# Patient Record
Sex: Female | Born: 1960
Health system: Southern US, Community
[De-identification: ages and names within clinical notes are randomized; demographics above are authoritative.]

## PROBLEM LIST (undated history)

## (undated) DIAGNOSIS — D6851 Activated protein C resistance: Secondary | ICD-10-CM

## (undated) DIAGNOSIS — E039 Hypothyroidism, unspecified: Secondary | ICD-10-CM

## (undated) DIAGNOSIS — N200 Calculus of kidney: Secondary | ICD-10-CM

## (undated) DIAGNOSIS — C4491 Basal cell carcinoma of skin, unspecified: Secondary | ICD-10-CM

## (undated) DIAGNOSIS — N029 Recurrent and persistent hematuria with unspecified morphologic changes: Secondary | ICD-10-CM

## (undated) DIAGNOSIS — G43909 Migraine, unspecified, not intractable, without status migrainosus: Secondary | ICD-10-CM

## (undated) HISTORY — PX: COLONOSCOPY: SHX174

## (undated) HISTORY — PX: UPPER GASTROINTESTINAL ENDOSCOPY: SHX188

## (undated) HISTORY — DX: Migraine, unspecified, not intractable, without status migrainosus: G43.909

## (undated) HISTORY — DX: Calculus of kidney: N20.0

## (undated) HISTORY — PX: EYE SURGERY: SHX253

## (undated) HISTORY — DX: Recurrent and persistent hematuria with unspecified morphologic changes: N02.9

## (undated) HISTORY — DX: Basal cell carcinoma of skin, unspecified: C44.91

## (undated) HISTORY — PX: RHINOPLASTY: SUR1284

---

## 1998-07-07 ENCOUNTER — Encounter: Admission: RE | Admit: 1998-07-07 | Discharge: 1998-10-05 | Payer: Self-pay | Admitting: Endocrinology

## 2001-04-28 ENCOUNTER — Other Ambulatory Visit: Admission: RE | Admit: 2001-04-28 | Discharge: 2001-04-28 | Payer: Self-pay | Admitting: Gynecology

## 2002-04-30 ENCOUNTER — Other Ambulatory Visit: Admission: RE | Admit: 2002-04-30 | Discharge: 2002-04-30 | Payer: Self-pay | Admitting: Gynecology

## 2003-04-10 ENCOUNTER — Other Ambulatory Visit: Admission: RE | Admit: 2003-04-10 | Discharge: 2003-04-10 | Payer: Self-pay | Admitting: Gynecology

## 2004-04-13 ENCOUNTER — Other Ambulatory Visit: Admission: RE | Admit: 2004-04-13 | Discharge: 2004-04-13 | Payer: Self-pay | Admitting: Gynecology

## 2004-10-07 ENCOUNTER — Other Ambulatory Visit: Admission: RE | Admit: 2004-10-07 | Discharge: 2004-10-07 | Payer: Self-pay | Admitting: Gynecology

## 2005-04-23 ENCOUNTER — Other Ambulatory Visit: Admission: RE | Admit: 2005-04-23 | Discharge: 2005-04-23 | Payer: Self-pay | Admitting: Gynecology

## 2006-03-11 ENCOUNTER — Other Ambulatory Visit: Admission: RE | Admit: 2006-03-11 | Discharge: 2006-03-11 | Payer: Self-pay | Admitting: Obstetrics and Gynecology

## 2006-06-07 HISTORY — PX: CERVICAL BIOPSY  W/ LOOP ELECTRODE EXCISION: SUR135

## 2007-04-26 ENCOUNTER — Other Ambulatory Visit: Admission: RE | Admit: 2007-04-26 | Discharge: 2007-04-26 | Payer: Self-pay | Admitting: Obstetrics and Gynecology

## 2007-08-28 ENCOUNTER — Other Ambulatory Visit: Admission: RE | Admit: 2007-08-28 | Discharge: 2007-08-28 | Payer: Self-pay | Admitting: Obstetrics and Gynecology

## 2008-04-26 ENCOUNTER — Encounter: Payer: Self-pay | Admitting: Obstetrics and Gynecology

## 2008-04-26 ENCOUNTER — Other Ambulatory Visit: Admission: RE | Admit: 2008-04-26 | Discharge: 2008-04-26 | Payer: Self-pay | Admitting: Obstetrics and Gynecology

## 2008-04-26 ENCOUNTER — Ambulatory Visit: Payer: Self-pay | Admitting: Obstetrics and Gynecology

## 2009-01-10 ENCOUNTER — Ambulatory Visit: Payer: Self-pay | Admitting: Obstetrics and Gynecology

## 2009-01-17 ENCOUNTER — Encounter: Payer: Self-pay | Admitting: Internal Medicine

## 2009-03-03 ENCOUNTER — Ambulatory Visit: Payer: Self-pay | Admitting: Internal Medicine

## 2009-03-03 DIAGNOSIS — R932 Abnormal findings on diagnostic imaging of liver and biliary tract: Secondary | ICD-10-CM | POA: Insufficient documentation

## 2009-03-14 ENCOUNTER — Ambulatory Visit: Payer: Self-pay | Admitting: Internal Medicine

## 2009-03-18 ENCOUNTER — Telehealth (INDEPENDENT_AMBULATORY_CARE_PROVIDER_SITE_OTHER): Payer: Self-pay | Admitting: *Deleted

## 2009-04-25 ENCOUNTER — Ambulatory Visit: Payer: Self-pay | Admitting: Internal Medicine

## 2009-05-07 ENCOUNTER — Ambulatory Visit: Payer: Self-pay | Admitting: Obstetrics and Gynecology

## 2009-05-07 ENCOUNTER — Other Ambulatory Visit: Admission: RE | Admit: 2009-05-07 | Discharge: 2009-05-07 | Payer: Self-pay | Admitting: Obstetrics and Gynecology

## 2010-05-15 ENCOUNTER — Other Ambulatory Visit
Admission: RE | Admit: 2010-05-15 | Discharge: 2010-05-15 | Payer: Self-pay | Source: Home / Self Care | Admitting: Gynecology

## 2010-05-15 ENCOUNTER — Ambulatory Visit: Payer: Self-pay | Admitting: Gynecology

## 2011-01-29 ENCOUNTER — Ambulatory Visit (INDEPENDENT_AMBULATORY_CARE_PROVIDER_SITE_OTHER): Payer: 59 | Admitting: Obstetrics and Gynecology

## 2011-01-29 ENCOUNTER — Telehealth: Payer: Self-pay | Admitting: Obstetrics and Gynecology

## 2011-01-29 ENCOUNTER — Encounter: Payer: Self-pay | Admitting: Obstetrics and Gynecology

## 2011-01-29 ENCOUNTER — Telehealth: Payer: Self-pay | Admitting: *Deleted

## 2011-01-29 DIAGNOSIS — N390 Urinary tract infection, site not specified: Secondary | ICD-10-CM

## 2011-01-29 DIAGNOSIS — R82998 Other abnormal findings in urine: Secondary | ICD-10-CM

## 2011-01-29 DIAGNOSIS — R319 Hematuria, unspecified: Secondary | ICD-10-CM

## 2011-01-29 DIAGNOSIS — R300 Dysuria: Secondary | ICD-10-CM

## 2011-01-29 MED ORDER — NITROFURANTOIN MONOHYD MACRO 100 MG PO CAPS: 100.0000 mg | ORAL_CAPSULE | Freq: Two times a day (BID) | ORAL | Status: AC

## 2011-01-29 NOTE — Telephone Encounter (Signed)
Patient states UTI symptoms are getting worse.  Painful urination, tired, and head ache not going away.  She wasn't sure if this was all tied in.  Going out of town.  Can stop by to drop a U/A of shortly or if possible call RX in?

## 2011-01-29 NOTE — Telephone Encounter (Signed)
Left message on patient's mobile to contact office for urine check & ov today with Dr. Eda Paschal.

## 2011-01-29 NOTE — Telephone Encounter (Signed)
Have patient stop by for urinalysis and I will see her.

## 2011-01-29 NOTE — Progress Notes (Signed)
Patient came to see me today with a 3 day history of back pain dysuria frequency and urgency. Urinalysis shows too numerous to count white blood cells and red blood cells. She does have a history of microscopic hematuria.  Assessment: UTI  Plan: 1. Macrobid twice a day with food for 7 days 2. Follow-up urinalysis and culture in one week. Obviously the microscopic hematuria will not be completely gone but I want to be sure that it's not as severe as it was today

## 2011-01-29 NOTE — Telephone Encounter (Signed)
Patient called back and said she was on the way to be seen.

## 2011-01-29 NOTE — Telephone Encounter (Signed)
Patient had left msg about UTI symptoms. Urinary burning.  Wanted to leave U/A today, going out of town. Left msg to get more details.

## 2011-01-29 NOTE — Telephone Encounter (Deleted)
Left message on patient's mobile to contact office regarding urine check & ov today with Dr. Eda Paschal.

## 2011-01-29 NOTE — Telephone Encounter (Signed)
Laurel Dimmer Called at 1:20 pm and lm for patient to stop by for U/A or appointment.

## 2011-02-01 ENCOUNTER — Other Ambulatory Visit: Payer: 59

## 2011-02-05 ENCOUNTER — Ambulatory Visit (INDEPENDENT_AMBULATORY_CARE_PROVIDER_SITE_OTHER): Payer: 59 | Admitting: *Deleted

## 2011-02-05 DIAGNOSIS — N390 Urinary tract infection, site not specified: Secondary | ICD-10-CM

## 2011-02-05 DIAGNOSIS — R300 Dysuria: Secondary | ICD-10-CM

## 2011-02-05 DIAGNOSIS — R823 Hemoglobinuria: Secondary | ICD-10-CM

## 2011-02-09 ENCOUNTER — Telehealth: Payer: Self-pay

## 2011-02-10 ENCOUNTER — Other Ambulatory Visit: Payer: Self-pay

## 2011-02-10 DIAGNOSIS — N390 Urinary tract infection, site not specified: Secondary | ICD-10-CM

## 2011-02-10 NOTE — Telephone Encounter (Signed)
OPENED IN ERROR

## 2011-03-08 ENCOUNTER — Other Ambulatory Visit: Payer: 59

## 2011-03-08 DIAGNOSIS — R823 Hemoglobinuria: Secondary | ICD-10-CM

## 2011-03-08 DIAGNOSIS — N390 Urinary tract infection, site not specified: Secondary | ICD-10-CM

## 2011-03-10 ENCOUNTER — Telehealth: Payer: Self-pay | Admitting: *Deleted

## 2011-03-10 MED ORDER — NITROFURANTOIN MONOHYD MACRO 100 MG PO CAPS: 100.0000 mg | ORAL_CAPSULE | Freq: Two times a day (BID) | ORAL | Status: AC

## 2011-03-10 NOTE — Telephone Encounter (Signed)
Pt had a UTI treated 02/10/11 with Cipro 250 mg BID 7days and negative culture on 03/08/11 for TOC. She now  Complains of increased urinary frequency which was the same way the last UTI started out. Pt is asking for another RX esp since she was just here on  10/1 with a negative culture. Pls advise KW

## 2011-03-10 NOTE — Telephone Encounter (Signed)
Pt informed rx sent.

## 2011-03-10 NOTE — Telephone Encounter (Signed)
Please treat the patient with Macrobid twice a day with food for 7 days. I would like to see her in approximately one week for followup urine into discussed that this may be overactive bladder symptoms.

## 2011-03-29 ENCOUNTER — Other Ambulatory Visit: Payer: Self-pay | Admitting: *Deleted

## 2011-03-29 ENCOUNTER — Encounter: Payer: Self-pay | Admitting: Gynecology

## 2011-03-29 DIAGNOSIS — N390 Urinary tract infection, site not specified: Secondary | ICD-10-CM

## 2011-03-29 DIAGNOSIS — G43909 Migraine, unspecified, not intractable, without status migrainosus: Secondary | ICD-10-CM | POA: Insufficient documentation

## 2011-03-29 DIAGNOSIS — E079 Disorder of thyroid, unspecified: Secondary | ICD-10-CM | POA: Insufficient documentation

## 2011-04-02 ENCOUNTER — Telehealth: Payer: Self-pay | Admitting: *Deleted

## 2011-04-02 NOTE — Telephone Encounter (Signed)
Patient called today to say she is wanting to cancel appointment for next week to discuss bladder issues.  Said she has annual exam scheduled and will follow up then.  If she has any further issues before then she will make an appointment to come back in .

## 2011-04-05 ENCOUNTER — Ambulatory Visit: Payer: 59 | Admitting: Obstetrics and Gynecology

## 2011-05-20 ENCOUNTER — Encounter: Payer: Self-pay | Admitting: Obstetrics and Gynecology

## 2011-05-24 ENCOUNTER — Other Ambulatory Visit: Payer: Self-pay | Admitting: *Deleted

## 2011-05-24 DIAGNOSIS — N63 Unspecified lump in unspecified breast: Secondary | ICD-10-CM

## 2011-05-26 ENCOUNTER — Encounter: Payer: Self-pay | Admitting: Obstetrics and Gynecology

## 2011-05-26 ENCOUNTER — Other Ambulatory Visit: Payer: Self-pay | Admitting: Gynecology

## 2011-05-26 ENCOUNTER — Ambulatory Visit (INDEPENDENT_AMBULATORY_CARE_PROVIDER_SITE_OTHER): Payer: 59 | Admitting: Obstetrics and Gynecology

## 2011-05-26 ENCOUNTER — Other Ambulatory Visit (HOSPITAL_COMMUNITY)
Admission: RE | Admit: 2011-05-26 | Discharge: 2011-05-26 | Disposition: A | Discharge status: Home / Self Care | Payer: 59 | Source: Ambulatory Visit | Attending: Obstetrics and Gynecology | Admitting: Obstetrics and Gynecology

## 2011-05-26 VITALS — BP 120/78 | Ht 67.0 in | Wt 142.0 lb

## 2011-05-26 DIAGNOSIS — Z01419 Encounter for gynecological examination (general) (routine) without abnormal findings: Secondary | ICD-10-CM

## 2011-05-26 DIAGNOSIS — R823 Hemoglobinuria: Secondary | ICD-10-CM

## 2011-05-26 DIAGNOSIS — N63 Unspecified lump in unspecified breast: Secondary | ICD-10-CM

## 2011-05-26 DIAGNOSIS — E039 Hypothyroidism, unspecified: Secondary | ICD-10-CM

## 2011-05-26 MED ORDER — LEVOTHYROXINE SODIUM 125 MCG PO TABS: 125.0000 ug | ORAL_TABLET | Freq: Every day | ORAL | Status: DC

## 2011-05-26 NOTE — Progress Notes (Signed)
Patient came to see me today for her annual GYN exam. She still has very heavy periods but not enough to require endometrial ablation. She just got recall for followup mammogram and is going today. She contraceptives by vasectomy. This year she had problems with a UTI which required 2 rounds of antibiotics to get rid of but she is now asymptomatic. We are treating her for hypothyroidism. She is currently asymptomatic.  Physical examination:  Sheila Strickland present HEENT within normal limits. Neck: Thyroid not large. No masses. Supraclavicular nodes: not enlarged. Breasts: Examined in both sitting midline position. No skin changes and no masses. Abdomen: Soft no guarding rebound or masses or hernia. Pelvic: External: Within normal limits. BUS: Within normal limits. Vaginal:within normal limits. Good estrogen effect. No evidence of cystocele rectocele or enterocele. Cervix: clean. Uterus: Normal size and shape. Adnexa: No masses. Rectovaginal exam: Confirmatory and negative. Extremities: Within normal limits.  Assessment: #1. Menorrhagia #2. Hypothyroidism  Plan: TSH checked. Continue Synthroid. Information given on Lysteda. Followup mammogram.

## 2011-05-26 NOTE — Progress Notes (Signed)
Addended byCammie Mcgee T on: 05/26/2011 10:26 AM   Modules accepted: Orders

## 2011-06-30 ENCOUNTER — Other Ambulatory Visit: Payer: Self-pay | Admitting: Dermatology

## 2011-07-29 ENCOUNTER — Other Ambulatory Visit: Payer: Self-pay | Admitting: Dermatology

## 2011-07-31 ENCOUNTER — Other Ambulatory Visit: Payer: Self-pay | Admitting: Gynecology

## 2011-08-02 ENCOUNTER — Other Ambulatory Visit: Payer: Self-pay | Admitting: *Deleted

## 2011-08-02 MED ORDER — LEVOTHYROXINE SODIUM 125 MCG PO TABS: 125.0000 ug | ORAL_TABLET | Freq: Every day | ORAL | Status: DC

## 2011-08-02 NOTE — Telephone Encounter (Signed)
Pt called requesting refill on synthroid 12 mcg, pt asked if we could place okay to take generic brand. rx sent to pharmacy for pt.

## 2011-08-04 ENCOUNTER — Encounter: Payer: Self-pay | Admitting: Internal Medicine

## 2011-08-18 ENCOUNTER — Encounter: Payer: Self-pay | Admitting: Internal Medicine

## 2011-09-28 ENCOUNTER — Encounter: Payer: 59 | Admitting: Internal Medicine

## 2011-10-06 HISTORY — PX: COLONOSCOPY: SHX174

## 2011-10-13 ENCOUNTER — Ambulatory Visit (AMBULATORY_SURGERY_CENTER): Payer: 59 | Admitting: *Deleted

## 2011-10-13 ENCOUNTER — Encounter: Payer: Self-pay | Admitting: Internal Medicine

## 2011-10-13 VITALS — Ht 67.0 in | Wt 139.6 lb

## 2011-10-13 DIAGNOSIS — Z1211 Encounter for screening for malignant neoplasm of colon: Secondary | ICD-10-CM

## 2011-10-13 MED ORDER — PEG-KCL-NACL-NASULF-NA ASC-C 100 G PO SOLR: 1.0000 | Freq: Once | ORAL | Status: DC

## 2011-10-22 ENCOUNTER — Encounter: Payer: 59 | Admitting: Internal Medicine

## 2011-10-25 ENCOUNTER — Encounter: Payer: Self-pay | Admitting: Internal Medicine

## 2011-10-25 ENCOUNTER — Ambulatory Visit (AMBULATORY_SURGERY_CENTER): Payer: 59 | Admitting: Internal Medicine

## 2011-10-25 VITALS — BP 132/77 | HR 64 | Temp 97.0°F | Resp 16 | Ht 67.0 in | Wt 139.0 lb

## 2011-10-25 DIAGNOSIS — Z1211 Encounter for screening for malignant neoplasm of colon: Secondary | ICD-10-CM

## 2011-10-25 MED ORDER — SODIUM CHLORIDE 0.9 % IV SOLN: 500.0000 mL | INTRAVENOUS | Status: DC

## 2011-10-25 NOTE — Patient Instructions (Signed)
YOU HAD AN ENDOSCOPIC PROCEDURE TODAY AT THE [facility]: Refer to the procedure report that was given to you for any specific questions about what was found during the examination.  If the procedure report does not answer your questions, please call your gastroenterologist to clarify.  If you requested that your care partner not be given the details of your procedure findings, then the procedure report has been included in a sealed envelope for you to review at your convenience later.  YOU SHOULD EXPECT: Some feelings of bloating in the abdomen. Passage of more gas than usual.  Walking can help get rid of the air that was put into your GI tract during the procedure and reduce the bloating. If you had a lower endoscopy (such as a colonoscopy or flexible sigmoidoscopy) you may notice spotting of blood in your stool or on the toilet paper. If you underwent a bowel prep for your procedure, then you may not have a normal bowel movement for a few days.  DIET: Your first meal following the procedure should be a light meal and then it is ok to progress to your normal diet.  A half-sandwich or bowl of soup is an example of a good first meal.  Heavy or fried foods are harder to digest and may make you feel nauseous or bloated.  Likewise meals heavy in dairy and vegetables can cause extra gas to form and this can also increase the bloating.  Drink plenty of fluids but you should avoid alcoholic beverages for 24 hours.  ACTIVITY: Your care partner should take you home directly after the procedure.  You should plan to take it easy, moving slowly for the rest of the day.  You can resume normal activity the day after the procedure however you should NOT DRIVE or use heavy machinery for 24 hours (because of the sedation medicines used during the test).    SYMPTOMS TO REPORT IMMEDIATELY: A gastroenterologist can be reached at any hour.  During normal business hours, 8:30 AM to 5:00 PM Monday through Friday,  call [phone].  After hours and on weekends, please call the GI answering service at [phone] who will take a message and have the physician on call contact you.   Following lower endoscopy (colonoscopy or flexible sigmoidoscopy):  Excessive amounts of blood in the stool  Significant tenderness or worsening of abdominal pains  Swelling of the abdomen that is new, acute  Fever of 100F or higher    FOLLOW UP: If any biopsies were taken you will be contacted by phone or by letter within the next 1-3 weeks.  Call your gastroenterologist if you have not heard about the biopsies in 3 weeks.  Our staff will call the home number listed on your records the next business day following your procedure to check on you and address any questions or concerns that you may have at that time regarding the information given to you following your procedure. This is a courtesy call and so if there is no answer at the home number and we have not heard from you through the emergency physician on call, we will assume that you have returned to your regular daily activities without incident.  SIGNATURES/CONFIDENTIALITY: You and/or your care partner have signed paperwork which will be entered into your electronic medical record.  These signatures attest to the fact that that the information above on your After Visit Summary has been reviewed and is understood.  Full responsibility of the confidentiality   of this discharge information lies with you and/or your care-partner.     

## 2011-10-25 NOTE — Progress Notes (Signed)
Patient did not have preoperative order for IV antibiotic SSI prophylaxis. (G8918)  Patient did not experience any of the following events: a burn prior to discharge; a fall within the facility; wrong site/side/patient/procedure/implant event; or a hospital transfer or hospital admission upon discharge from the facility. (G8907)  

## 2011-10-25 NOTE — Op Note (Signed)
[facility] [address] [place], [place]  [address]  COLONOSCOPY PROCEDURE REPORT  PATIENT:  Sheila Strickland, Sheila Strickland  MR#:  [id number] BIRTHDATE:  04-15-1961, 50 yrs. old  GENDER:  female ENDOSCOPIST:  Wilhemina Bonito. Eda Keys, MD REF. BY:  Edyth Gunnels, M.D. PROCEDURE DATE:  10/25/2011 PROCEDURE:  Average-risk screening colonoscopy G0121 ASA CLASS:  Class I INDICATIONS:  Routine Risk Screening MEDICATIONS:   MAC sedation, administered by CRNA, propofol (Diprivan) 360 mg IV  DESCRIPTION OF PROCEDURE:   After the risks benefits and alternatives of the procedure were thoroughly explained, informed consent was obtained.  Digital rectal exam was performed and revealed no abnormalities.   The LB [id number]-[id number] endoscope was introduced through the anus and advanced to the cecum, which was identified by both the appendix and ileocecal valve, without limitations.  The quality of the prep was excellent, using MoviPrep.  The instrument was then slowly withdrawn as the colon was fully examined. <<PROCEDUREIMAGES>>  FINDINGS:  A normal appearing cecum, ileocecal valve, and appendiceal orifice were identified. The ascending, hepatic flexure, transverse, splenic flexure, descending, sigmoid colon, and rectum appeared unremarkable.  No polyps or cancers were seen. Retroflexed views in the rectum revealed no abnormalities.    The time to cecum =  5:18  minutes. The scope was then withdrawn in 11:20  minutes from the cecum and the procedure completed.  COMPLICATIONS:  None  ENDOSCOPIC IMPRESSION: 1) Normal colon 2) No polyps or cancers RECOMMENDATIONS: 1) Continue current colorectal screening recommendations for "routine risk" patients with a repeat colonoscopy in 10 years.  ______________________________ Wilhemina Bonito. Eda Keys, MD  CC:  Carmelina Peal, MD;  The Patient  n. eSIGNED:   Wilhemina Bonito. Eda Keys at 10/25/2011 10:00 AM  Willey Blade, [phone]

## 2011-10-26 ENCOUNTER — Telehealth: Payer: Self-pay

## 2011-10-26 NOTE — Telephone Encounter (Signed)
  Follow up Call-  Call back number 10/25/2011  Post procedure Call Back phone  # [phone] cell  Permission to leave phone message Yes     Patient questions:  Do you have a fever, pain , or abdominal swelling? no Pain Score  0 *  Have you tolerated food without any problems? yes  Have you been able to return to your normal activities? yes  Do you have any questions about your discharge instructions: Diet   no Medications  no Follow up visit  no  Do you have questions or concerns about your Care? no  Actions: * If pain score is 4 or above: No action needed, pain <4.

## 2012-02-02 ENCOUNTER — Ambulatory Visit (INDEPENDENT_AMBULATORY_CARE_PROVIDER_SITE_OTHER): Payer: 59

## 2012-02-02 ENCOUNTER — Other Ambulatory Visit: Payer: Self-pay | Admitting: Obstetrics and Gynecology

## 2012-02-02 ENCOUNTER — Ambulatory Visit (INDEPENDENT_AMBULATORY_CARE_PROVIDER_SITE_OTHER): Payer: 59 | Admitting: Obstetrics and Gynecology

## 2012-02-02 DIAGNOSIS — R140 Abdominal distension (gaseous): Secondary | ICD-10-CM

## 2012-02-02 DIAGNOSIS — N852 Hypertrophy of uterus: Secondary | ICD-10-CM

## 2012-02-02 DIAGNOSIS — D259 Leiomyoma of uterus, unspecified: Secondary | ICD-10-CM

## 2012-02-02 DIAGNOSIS — R142 Eructation: Secondary | ICD-10-CM

## 2012-02-02 DIAGNOSIS — N831 Corpus luteum cyst of ovary, unspecified side: Secondary | ICD-10-CM

## 2012-02-02 DIAGNOSIS — R141 Gas pain: Secondary | ICD-10-CM

## 2012-02-02 DIAGNOSIS — D252 Subserosal leiomyoma of uterus: Secondary | ICD-10-CM

## 2012-02-02 NOTE — Progress Notes (Addendum)
Patient came to see me today with a 3 month history of abdominal bloating. It is there most days. It is not associated with nausea, vomiting or change in bowel habits. She is having no urinary symptoms. She continues to have monthly cycles. It is not related to her cycles. She feels like her stomach is very tight but is not get worse or better with eating. She has had a colonoscopy through [facility] office.her husband has had a vasectomy.  Exam: Kennon Portela present. Abdomen is soft without guarding rebound or masses. There is no evidence of ascites.Pelvic exam: External within normal limits. BUS within normal limits. Vaginal exam within normal limits. Cervix is clean without lesions. Uterus is normal size and shape. Adnexa failed to reveal masses. Rectovaginal examination is confirmatory and without masses.   Assessment: Abdominal bloating  Plan: Pelvic ultrasound.  The patient came back this afternoon and we did an ultrasound. She has a very large fibroid 5 cm that is subserosal and I suspect is causing some of her symptoms. There is an arterial blood flow to it. In addition to this fibroid there is an echogenic focus in the endometrial cavity of 1.2 cm that is either another fibroid or an endometrial polyp or endometrial echo has changed from 10.3 mm in 2008 to 22 mm today. Her right ovary is normal. Her left ovary has a thick walled cyst with internal low level echoes of 3 by 2 x 2 centimeters. There is minimal fluid in her cul-de-sac. Our plan will be to address the endometrial cavity lesion first and we will schedule a D&C hysteroscopy. I have also asked to see Dr. Marina Goodell to be sure there isn't a GI reason for some of her abdominal bloating. She will then have to the side with a symptoms warrant hysterectomy. Our plan will be prior to that 2 rescan her and see if she's had resolution of the left ovarian cyst. Her surgery will be scheduled.

## 2012-02-02 NOTE — Patient Instructions (Signed)
Schedule pelvic ultrasound

## 2012-02-03 ENCOUNTER — Encounter (HOSPITAL_COMMUNITY): Payer: Self-pay

## 2012-02-03 ENCOUNTER — Telehealth: Payer: Self-pay | Admitting: Obstetrics and Gynecology

## 2012-02-03 NOTE — Telephone Encounter (Signed)
I called patient to let her know I had scheduled her outpatient surgery for Sept 9.  Patient had some concerns if she might not be finished with menses by then and asked to schedule it later in the week.  I rescheduled her for Thurs Sept 12 1:00pm at [facility].

## 2012-02-08 ENCOUNTER — Telehealth: Payer: Self-pay | Admitting: *Deleted

## 2012-02-08 NOTE — Telephone Encounter (Signed)
Pt is scheduled for Hysteroscopy, D&C on 02/17/12, pt asked if she could have ablation done same day?

## 2012-02-08 NOTE — Telephone Encounter (Signed)
Is not technically possible to do so because of whatever is in the endometrial cavity. If she has other questions get me to the phone.

## 2012-02-08 NOTE — Telephone Encounter (Signed)
Pt informed with the below note. 

## 2012-02-11 ENCOUNTER — Encounter (HOSPITAL_COMMUNITY): Payer: Self-pay | Admitting: *Deleted

## 2012-02-14 NOTE — H&P (Signed)
  Chief complaint: Intrauterine cavity defect with menorrhagia  History of present illness: Patient is a 51 year old gravida 2 para 2 AB 0 who presented to the office with a 3 month history of abdominal bloating and lower abdominal pressure. Over the past 1-2 years patient has also suffered from significant menorrhagia but has elected no definitive treatment with hopes that  menopause will intervene. When she presented to the office with her new symptoms she underwent pelvic ultrasound which revealed a large 5 cm fibroid and a thick walled 3 cm left ovarian cyst. In addition there was an intrauterine cavity defect of 1-2 cm. The plan is to proceed with hysteroscopy, D&C with removal of intrauterine pathology. Pending pathology report and whether left ovarian cyst resolves and abdominal symptoms persist future surgery may be planned.   Past medical history, family history, social history, and review of systems in epic record and reviewed.  Physical examination: HEENT within normal limits. Neck: Thyroid not large. No masses. Supraclavicular nodes: not enlarged. Breasts: Examined in both sitting and lying  position. No skin changes and no masses. Abdomen: Soft no guarding rebound or masses or hernia. Pelvic: External: Within normal limits. BUS: Within normal limits. Vaginal:within normal limits. Good estrogen effect. No evidence of cystocele rectocele or enterocele. Cervix: clean. Uterus: enlarged by 5 cm fibroid. Adnexa: No masses. Cyst on left ovary not palpable. Rectovaginal exam: Confirmatory and negative. Extremities: Within normal limits.  Assessment: #1. Menorrhagia with intrauterine cavity defect. #2 large fibroid #3 left ovarian cyst.  Plan: D&C, hysteroscopy with excision of cavity defect.

## 2012-02-17 ENCOUNTER — Encounter (HOSPITAL_COMMUNITY): Payer: Self-pay | Admitting: Registered Nurse

## 2012-02-17 ENCOUNTER — Encounter (HOSPITAL_COMMUNITY): Payer: Self-pay | Admitting: *Deleted

## 2012-02-17 ENCOUNTER — Encounter (HOSPITAL_COMMUNITY)
Admission: RE | Disposition: A | Discharge status: Home / Self Care | Payer: Self-pay | Source: Ambulatory Visit | Attending: Obstetrics and Gynecology

## 2012-02-17 ENCOUNTER — Ambulatory Visit (HOSPITAL_COMMUNITY): Payer: 59 | Admitting: Registered Nurse

## 2012-02-17 ENCOUNTER — Ambulatory Visit (HOSPITAL_COMMUNITY)
Admission: RE | Admit: 2012-02-17 | Discharge: 2012-02-17 | Disposition: A | Discharge status: Home / Self Care | Payer: 59 | Source: Ambulatory Visit | Attending: Obstetrics and Gynecology | Admitting: Obstetrics and Gynecology

## 2012-02-17 DIAGNOSIS — D251 Intramural leiomyoma of uterus: Secondary | ICD-10-CM | POA: Insufficient documentation

## 2012-02-17 DIAGNOSIS — N920 Excessive and frequent menstruation with regular cycle: Secondary | ICD-10-CM | POA: Insufficient documentation

## 2012-02-17 DIAGNOSIS — N840 Polyp of corpus uteri: Secondary | ICD-10-CM

## 2012-02-17 HISTORY — DX: Hypothyroidism, unspecified: E03.9

## 2012-02-17 HISTORY — PX: DILATION AND CURETTAGE OF UTERUS: SHX78

## 2012-02-17 HISTORY — PX: HYSTEROSCOPY W/D&C: SHX1775

## 2012-02-17 LAB — HCG, SERUM, QUALITATIVE: Preg, Serum: NEGATIVE

## 2012-02-17 LAB — CBC
Hemoglobin: 14.4 g/dL (ref 12.0–15.0)
MCHC: 33 g/dL (ref 30.0–36.0)
Platelets: 169 10*3/uL (ref 150–400)

## 2012-02-17 SURGERY — DILATION AND CURETTAGE
Anesthesia: General | Site: Uterus | Wound class: Clean Contaminated

## 2012-02-17 MED ORDER — MIDAZOLAM HCL 5 MG/5ML IJ SOLN
INTRAMUSCULAR | Status: DC | PRN
  Administered 2012-02-17: 2 mg via INTRAVENOUS

## 2012-02-17 MED ORDER — GLYCINE 1.5 % IR SOLN
Status: DC | PRN
  Administered 2012-02-17: 3000 mL

## 2012-02-17 MED ORDER — PROPOFOL 10 MG/ML IV EMUL
INTRAVENOUS | Status: AC
  Filled 2012-02-17: qty 20

## 2012-02-17 MED ORDER — DEXAMETHASONE SODIUM PHOSPHATE 10 MG/ML IJ SOLN
INTRAMUSCULAR | Status: DC | PRN
  Administered 2012-02-17: 10 mg via INTRAVENOUS

## 2012-02-17 MED ORDER — DEXAMETHASONE SODIUM PHOSPHATE 10 MG/ML IJ SOLN
INTRAMUSCULAR | Status: AC
  Filled 2012-02-17: qty 1

## 2012-02-17 MED ORDER — PROPOFOL 10 MG/ML IV BOLUS
INTRAVENOUS | Status: DC | PRN
  Administered 2012-02-17: 200 mg via INTRAVENOUS

## 2012-02-17 MED ORDER — CEFAZOLIN SODIUM-DEXTROSE 2-3 GM-% IV SOLR
INTRAVENOUS | Status: AC
  Filled 2012-02-17: qty 50

## 2012-02-17 MED ORDER — ONDANSETRON HCL 4 MG/2ML IJ SOLN: 4.0000 mg | Freq: Once | INTRAMUSCULAR | Status: DC | PRN

## 2012-02-17 MED ORDER — KETOROLAC TROMETHAMINE 30 MG/ML IJ SOLN: 15.0000 mg | Freq: Once | INTRAMUSCULAR | Status: DC | PRN

## 2012-02-17 MED ORDER — LIDOCAINE HCL 1 % IJ SOLN
INTRAMUSCULAR | Status: DC | PRN
  Administered 2012-02-17: 20 mL

## 2012-02-17 MED ORDER — LIDOCAINE HCL (CARDIAC) 20 MG/ML IV SOLN
INTRAVENOUS | Status: DC | PRN
  Administered 2012-02-17: 50 mg via INTRAVENOUS

## 2012-02-17 MED ORDER — KETOROLAC TROMETHAMINE 30 MG/ML IJ SOLN
INTRAMUSCULAR | Status: AC
  Filled 2012-02-17: qty 1

## 2012-02-17 MED ORDER — CEFAZOLIN SODIUM-DEXTROSE 2-3 GM-% IV SOLR
2.0000 g | INTRAVENOUS | Status: AC
  Administered 2012-02-17: 2 g via INTRAVENOUS

## 2012-02-17 MED ORDER — LIDOCAINE HCL (CARDIAC) 20 MG/ML IV SOLN
INTRAVENOUS | Status: AC
  Filled 2012-02-17: qty 5

## 2012-02-17 MED ORDER — ONDANSETRON HCL 4 MG/2ML IJ SOLN
INTRAMUSCULAR | Status: DC | PRN
  Administered 2012-02-17: 4 mg via INTRAVENOUS

## 2012-02-17 MED ORDER — MIDAZOLAM HCL 2 MG/2ML IJ SOLN
INTRAMUSCULAR | Status: AC
  Filled 2012-02-17: qty 2

## 2012-02-17 MED ORDER — FENTANYL CITRATE 0.05 MG/ML IJ SOLN: 25.0000 ug | INTRAMUSCULAR | Status: DC | PRN

## 2012-02-17 MED ORDER — FENTANYL CITRATE 0.05 MG/ML IJ SOLN
INTRAMUSCULAR | Status: DC | PRN
  Administered 2012-02-17 (×2): 50 ug via INTRAVENOUS

## 2012-02-17 MED ORDER — SODIUM CHLORIDE 0.9 % IR SOLN
Status: DC | PRN
  Administered 2012-02-17: 6000 mL

## 2012-02-17 MED ORDER — MEPERIDINE HCL 25 MG/ML IJ SOLN: 6.2500 mg | INTRAMUSCULAR | Status: DC | PRN

## 2012-02-17 MED ORDER — ONDANSETRON HCL 4 MG/2ML IJ SOLN
INTRAMUSCULAR | Status: AC
  Filled 2012-02-17: qty 2

## 2012-02-17 MED ORDER — KETOROLAC TROMETHAMINE 30 MG/ML IJ SOLN
INTRAMUSCULAR | Status: DC | PRN
  Administered 2012-02-17: 30 mg via INTRAVENOUS

## 2012-02-17 MED ORDER — LACTATED RINGERS IV SOLN
INTRAVENOUS | Status: DC
  Administered 2012-02-17 (×2): via INTRAVENOUS

## 2012-02-17 MED ORDER — FENTANYL CITRATE 0.05 MG/ML IJ SOLN
INTRAMUSCULAR | Status: AC
Start: 2012-02-17 — End: 2012-02-17
  Filled 2012-02-17: qty 2

## 2012-02-17 SURGICAL SUPPLY — 26 items
CANISTER SUCTION 2500CC (MISCELLANEOUS) ×2 IMPLANT
CLOTH BEACON ORANGE TIMEOUT ST (SAFETY) ×2 IMPLANT
CONTAINER PREFILL 10% NBF 60ML (FORM) ×4 IMPLANT
CORD ACTIVE DISPOSABLE (ELECTRODE) ×1
CORD ELECTRO ACTIVE DISP (ELECTRODE) ×1 IMPLANT
DRESSING TELFA 8X3 (GAUZE/BANDAGES/DRESSINGS) ×6 IMPLANT
ELECT LOOP GYNE PRO 24FR (CUTTING LOOP) ×2
ELECT REM PT RETURN 9FT ADLT (ELECTROSURGICAL)
ELECTRODE LOOP GYNE PRO 24FR (CUTTING LOOP) ×1 IMPLANT
ELECTRODE REM PT RTRN 9FT ADLT (ELECTROSURGICAL) IMPLANT
GLOVE BIOGEL PI IND STRL 7.0 (GLOVE) ×3 IMPLANT
GLOVE BIOGEL PI IND STRL 7.5 (GLOVE) ×2 IMPLANT
GLOVE BIOGEL PI INDICATOR 7.0 (GLOVE) ×3
GLOVE BIOGEL PI INDICATOR 7.5 (GLOVE) ×2
GLOVE ECLIPSE 7.0 STRL STRAW (GLOVE) ×2 IMPLANT
GLOVE NEODERM STER SZ 7 (GLOVE) ×6 IMPLANT
GOWN PREVENTION PLUS LG XLONG (DISPOSABLE) ×4 IMPLANT
GOWN STRL REIN XL XLG (GOWN DISPOSABLE) ×2 IMPLANT
LOOP ANGLED CUTTING 22FR (CUTTING LOOP) ×2 IMPLANT
MORCELLATOR ROTARY HYSTERO (ABLATOR) ×2 IMPLANT
NEEDLE SPNL 22GX3.5 QUINCKE BK (NEEDLE) ×2 IMPLANT
PACK VAGINAL MINOR WOMEN LF (CUSTOM PROCEDURE TRAY) ×2 IMPLANT
PAD OB MATERNITY 4.3X12.25 (PERSONAL CARE ITEMS) ×2 IMPLANT
SYR 20CC LL (SYRINGE) ×2 IMPLANT
TOWEL OR 17X24 6PK STRL BLUE (TOWEL DISPOSABLE) ×4 IMPLANT
WATER STERILE IRR 1000ML POUR (IV SOLUTION) ×2 IMPLANT

## 2012-02-17 NOTE — Transfer of Care (Signed)
Immediate Anesthesia Transfer of Care Note  Patient: Sheila Strickland  Procedure(s) Performed: Procedure(s) (LRB) with comments: DILATATION AND CURETTAGE (N/A) - with truclear  Patient Location: PACU  Anesthesia Type: General  Level of Consciousness: awake, alert  and patient cooperative  Airway & Oxygen Therapy: Patient Spontanous Breathing and Patient connected to nasal cannula oxygen  Post-op Assessment: Report given to PACU RN  Post vital signs: Reviewed  Complications: No apparent anesthesia complications

## 2012-02-17 NOTE — Anesthesia Preprocedure Evaluation (Addendum)
Anesthesia Evaluation  Patient identified by MRN, date of birth, ID band Patient awake    Reviewed: Allergy & Precautions, H&P , NPO status , Patient's Chart, lab work & pertinent test results  Airway Mallampati: I TM Distance: >3 FB Neck ROM: full    Dental No notable dental hx. (+) Teeth Intact   Pulmonary neg pulmonary ROS,    Pulmonary exam normal       Cardiovascular negative cardio ROS      Neuro/Psych negative psych ROS   GI/Hepatic negative GI ROS, Neg liver ROS,   Endo/Other  Hypothyroidism   Renal/GU negative Renal ROS  negative genitourinary   Musculoskeletal negative musculoskeletal ROS (+)   Abdominal Normal abdominal exam  (+)   Peds negative pediatric ROS (+)  Hematology negative hematology ROS (+)   Anesthesia Other Findings   Reproductive/Obstetrics negative OB ROS                           Anesthesia Physical Anesthesia Plan  ASA: II  Anesthesia Plan: General   Post-op Pain Management:    Induction: Intravenous  Airway Management Planned: LMA  Additional Equipment:   Intra-op Plan:   Post-operative Plan:   Informed Consent: I have reviewed the patients History and Physical, chart, labs and discussed the procedure including the risks, benefits and alternatives for the proposed anesthesia with the patient or authorized representative who has indicated his/her understanding and acceptance.     Plan Discussed with: CRNA and Surgeon  Anesthesia Plan Comments:         Anesthesia Quick Evaluation

## 2012-02-17 NOTE — Interval H&P Note (Signed)
History and Physical Interval Note:  02/17/2012 12:46 PM  Sheila Strickland  has presented today for surgery, with the diagnosis of submucous myoma  The various methods of treatment have been discussed with the patient and family. After consideration of risks, benefits and other options for treatment, the patient has consented to  Procedure(s) (LRB) with comments: DILATATION AND CURETTAGE /HYSTEROSCOPY (N/A) - Hysteroscopy, D&C with Resectoscope and TruClear Morcellator. I have arranged for rep.,Holly Bishop to be present for case.  Needs 1 hour as a surgical intervention .  The patient's history has been reviewed, patient examined, no change in status, stable for surgery.  I have reviewed the patient's chart and labs.  Questions were answered to the patient's satisfaction.     GOTTSEGEN,DANIEL L

## 2012-02-17 NOTE — Anesthesia Postprocedure Evaluation (Signed)
  Anesthesia Post-op Note  Patient: Sheila Strickland  Procedure(s) Performed: Procedure(s) (LRB) with comments: DILATATION AND CURETTAGE (N/A) - attempted with truclear DILATATION AND CURETTAGE /HYSTEROSCOPY (N/A) - with polyp resection X 2 polyps  Patient Location: PACU  Anesthesia Type: General  Level of Consciousness: awake, alert  and oriented  Airway and Oxygen Therapy: Patient Spontanous Breathing  Post-op Pain: none  Post-op Assessment: Post-op Vital signs reviewed, Patient's Cardiovascular Status Stable, Respiratory Function Stable, Patent Airway, No signs of Nausea or vomiting and Pain level controlled  Post-op Vital Signs: Reviewed and stable  Complications: No apparent anesthesia complications

## 2012-02-17 NOTE — Preoperative (Signed)
Beta Blockers   Reason not to administer Beta Blockers:Not Applicable 

## 2012-02-17 NOTE — Op Note (Signed)
Preoperative diagnosis: Intrauterine cavity defect with menorrhagia. Intramural myoma.. Postoperative diagnosis: Endometrial polyps, menorrhagia, intramural myoma. Operation: Hysteroscopy with excision of endometrial polyps. Surgeon: Dr. Eda Paschal Anesthesia: Gen.  Findings: External and vaginal exam within normal limits. Cervix is clean. Uterus is enlarged by 5 cm fibroid coming off the right lateral wall. Neither adnexa is enlarged. Previous ovarian cyst on left ovary is not palpable. Uterus sounds to 8 cm. At the time of hysteroscopy patient had 2 endometrial polyps of greater than 1 cm each. One came off the anterior vaginal and one of the posterior wall. Other than that  the endometrial cavity was normal. Top of the  fundus, tubal ostia, anterior and posterior walls of the fundus, lower uterine segment, and endocervical canal were all  Normal. There was first degree uterine descensus.  Procedure: After general anesthesia the patient was placed in the dorsolithotomy position and prepped and draped in usual sterile manner. A speculum was placed in the vagina and a single-tooth tenaculum was placed the anterior lip of the cervix. The cervix was dilated to a #27 Pratt dilator. Initially the true clear hysteroscope was used with this entire system. Normal saline was used to expand the intrauterine cavity. The pathology was identified. When it was time to place the instrument to remove the polyps it was found that it been contaminated. There was no other sterile part. It was therefore removed. A hysteroscopic resectoscope was introduced with a single wire loop. The entire system was flushed with 1 and 1/2% glycine. Both the tubing and the intrauterine cavity were flushed. The cavity was flushed for 2 minutes. The appropriate Bovie settings were used with the resectoscope. The polyps were then excised without difficulty. There was no bleeding. Fluid deficit was 150 cc. Blood loss was minimal. All  instrumentation was removed. Tissue was sent to pathology for tissue diagnosis. The patient tolerated procedure well and left the operating room in a satisfactory condition.

## 2012-02-18 ENCOUNTER — Encounter (HOSPITAL_COMMUNITY): Payer: Self-pay | Admitting: Obstetrics and Gynecology

## 2012-02-24 ENCOUNTER — Telehealth: Payer: Self-pay | Admitting: *Deleted

## 2012-02-24 NOTE — Telephone Encounter (Signed)
Pt informed of pathology report 02/17/12. benign report.

## 2012-03-03 ENCOUNTER — Ambulatory Visit (INDEPENDENT_AMBULATORY_CARE_PROVIDER_SITE_OTHER): Payer: 59 | Admitting: Obstetrics and Gynecology

## 2012-03-03 ENCOUNTER — Encounter: Payer: Self-pay | Admitting: Obstetrics and Gynecology

## 2012-03-03 DIAGNOSIS — N920 Excessive and frequent menstruation with regular cycle: Secondary | ICD-10-CM

## 2012-03-03 DIAGNOSIS — N840 Polyp of corpus uteri: Secondary | ICD-10-CM

## 2012-03-03 NOTE — Progress Notes (Signed)
Patient came back to see me today for a postoperative visit. She had an uneventful recovery. Her pathology report showed benign endometrial polyps without hyperplasia. She stated that her abdominal bloating is now getting better. She has an appointment to see Dr. Marina Goodell to be sure she does not have a GI problem causing the bloating.  Exam: Leonard Schwartz present.Pelvic exam: External within normal limits. BUS within normal limits. Vaginal exam within normal limits. Cervix is clean without lesions. Uterus is normal size and shape with myoma of 5 cm originating from posterior right wall. Adnexa failed to reveal masses. Rectovaginal examination is confirmatory and without masses.   Assessment: #1. Menorrhagia #2. Endometrial polyps #3. Subserosal myoma  Plan: Discussed findings of surgery. Patient to see Dr. Marina Goodell. Patient is not symptomatic enough from  fibroid at this point for hysterectomy. If menorrhagia persists she will call and we will schedule endometrial ablation.

## 2012-03-03 NOTE — Patient Instructions (Signed)
Keep me  updated on menstrual cycles.

## 2012-03-22 ENCOUNTER — Ambulatory Visit: Payer: 59 | Admitting: Internal Medicine

## 2012-03-29 ENCOUNTER — Ambulatory Visit (INDEPENDENT_AMBULATORY_CARE_PROVIDER_SITE_OTHER): Payer: 59 | Admitting: Internal Medicine

## 2012-03-29 ENCOUNTER — Encounter: Payer: Self-pay | Admitting: Internal Medicine

## 2012-03-29 VITALS — BP 100/70 | HR 68 | Ht 66.5 in | Wt 139.5 lb

## 2012-03-29 DIAGNOSIS — R143 Flatulence: Secondary | ICD-10-CM

## 2012-03-29 DIAGNOSIS — R142 Eructation: Secondary | ICD-10-CM

## 2012-03-29 DIAGNOSIS — R1084 Generalized abdominal pain: Secondary | ICD-10-CM

## 2012-03-29 NOTE — Progress Notes (Addendum)
HISTORY OF PRESENT ILLNESS:  Sheila Strickland is a 51 y.o. female with the below listed medical history who presents today, at the request of her gynecologist, regarding a 5-6 month history of abdominal bloating or tightness. She states that she has had similar symptoms several times per year over the past several years. Problem generally resolve spontaneously within 4 weeks.Marland Kitchen However, this occurrence is more persistent and troublesome. She describes a fullness sensation in the lower abdomen shortly after awakening. She does not describe it as pain, but rather tightness. The discomfort is fairly constant. There has not been increased belching or flatus. She denies any change in bowel habits. No new medications or diet. Mild relief with defecation. She was seen in this office in November of 2010 for followup regarding nausea and globus type sensation. As well, regarding stable hepatic hemangioma and gallbladder polyps. See that dictation. Prior upper endoscopy to evaluate these complaints was performed October 2010 and found to be unremarkable. She was seen again in May of 2013 for screening colonoscopy which was entirely normal. This year she underwent her routine urologic evaluation for chronic hematuria. She also has undergone gynecology evaluations for abnormal uterine bleeding. A transvaginal ultrasound performed 02/02/2012 revealed a 5 cm uterine fibroid. She subsequently underwent a D&C 02/17/2012 to address uterine polyps and bleeding. She denies back pain, weight loss, or fevers. Recently returned from [place]. Review of CBC from 02/17/2012 found the study to be normal with hemoglobin 14.4  REVIEW OF SYSTEMS:  All non-GI ROS negative except for headaches  Past Medical History  Diagnosis Date  . CIN I (cervical intraepithelial neoplasia I) 2008    LEEP  . Hematuria   . Hypothyroidism   . SVD (spontaneous vaginal delivery)     x 2  . Migraines     opc med prns  . Basal cell carcinoma     arm,  thigh, hand    Past Surgical History  Procedure Date  . Cervical biopsy  w/ loop electrode excision 2008  . Colposcopy   . Rhinoplasty   . Upper gastrointestinal endoscopy   . Colonoscopy   . Eye surgery     Lasik eye - bilateral  . Dilation and curettage of uterus 02/17/2012    Procedure: DILATATION AND CURETTAGE;  Surgeon: Trellis Paganini, MD;  Location: [facility];  Service: Gynecology;  Laterality: N/A;  attempted with truclear  . Hysteroscopy w/d&c 02/17/2012    Procedure: DILATATION AND CURETTAGE /HYSTEROSCOPY;  Surgeon: Trellis Paganini, MD;  Location: [facility];  Service: Gynecology;  Laterality: N/A;  with polyp resection X 2 polyps    Social History Sheila Strickland  reports that she has never smoked. She has never used smokeless tobacco. She reports that she drinks about one ounce of alcohol per week. She reports that she does not use illicit drugs.  family history includes Anuerysm in her father; Breast cancer in her maternal aunt; Colon polyps in her mother; and Hypertension in her father.  There is no history of Colon cancer, and Esophageal cancer, and Rectal cancer, and Stomach cancer, .  No Known Allergies     PHYSICAL EXAMINATION: Vital signs: BP 100/70  Pulse 68  Ht 5' 6.5" (1.689 m)  Wt 139 lb 8 oz (63.277 kg)  BMI 22.18 kg/m2 General: Well-developed, well-nourished, no acute distress HEENT: Sclerae are anicteric, conjunctiva pink. Oral mucosa intact Lungs: Clear Heart: Regular Abdomen: soft, nontender, nondistended, no obvious ascites, no peritoneal signs, normal bowel sounds. No organomegaly.  Extremities: No edema Psychiatric: alert and oriented x3. Cooperative    ASSESSMENT:  #1. Chronic and persistent lower abdominal discomfort described as bloating or tightness. Not obviously GI in origin. Negative recent urologic and GYN evaluations. She is known to have a 5 cm uterine fibroid. Previous colonoscopy and upper endoscopy normal.  PLAN:  #1. Empiric  trial of probiotic Align. 3 weeks of samples given #2. Educational literature on intestinal gas, bloating, and dietary sheet. #3. Contrast-enhanced CT scan of the abdomen and pelvis to rule out other pathologic entities such as retroperitoneal disease. Further recommendations thereafter if needed.  ADDENDUM After relieving the office, the patient realize that she had an imaging study performed at her urologists office earlier this year. Indeed, a CT scan of the abdomen and pelvis with and without contrast was performed 07/20/2011. The examination revealed bilateral nonobstructing nephrolithiasis, uterine fibroid, and hemangioma of the liver (previously aware of). As such, we will hold off on CT scan imaging as planned above. She will continue with probiotic and anti-gas measures and contact the office if her problem persists or worsens. We have discussed this with her by telephone today.

## 2012-03-29 NOTE — Patient Instructions (Addendum)
ou have been scheduled for a CT scan of the abdomen and pelvis at [facility] ([address]---this is in the same building as Architectural technologist).   You are scheduled on 03-31-12 at 9:30am. You should arrive 15 minutes prior to your appointment time for registration. Please follow the written instructions below on the day of your exam:  WARNING: IF YOU ARE ALLERGIC TO IODINE/X-RAY DYE, PLEASE NOTIFY RADIOLOGY IMMEDIATELY AT [phone]! YOU WILL BE GIVEN A 13 HOUR PREMEDICATION PREP.  1) Do not eat or drink anything after 5:30am (4 hours prior to your test) 2) You have been given 2 bottles of oral contrast to drink. The solution may taste better if refrigerated, but do NOT add ice or any other liquid to this solution. Shake well before drinking.    Drink 1 bottle of contrast @ 7:30am (2 hours prior to your exam)  Drink 1 bottle of contrast @ 8:30am (1 hour prior to your exam)  You may take any medications as prescribed with a small amount of water except for the following: Metformin, Glucophage, Glucovance, Avandamet, Riomet, Fortamet, Actoplus Met, Janumet, Glumetza or Metaglip. The above medications must be held the day of the exam AND 48 hours after the exam.  The purpose of you drinking the oral contrast is to aid in the visualization of your intestinal tract. The contrast solution may cause some diarrhea. Before your exam is started, you will be given a small amount of fluid to drink. Depending on your individual set of symptoms, you may also receive an intravenous injection of x-ray contrast/dye. Plan on being at [facility] for 30 minutes or long, depending on the type of exam you are having performed.  If you have any questions regarding your exam or if you need to reschedule, you may call the CT department at [phone] between the hours of 8:00 am and 5:00 pm, Monday-Friday.  We have given you samples of Align. This puts good bacteria back into your colon. You  should take 1 capsule by mouth once daily. If this works well for you, it can be purchased over the counter.   ________________________________________________________________________

## 2012-03-31 ENCOUNTER — Telehealth: Payer: Self-pay

## 2012-03-31 ENCOUNTER — Inpatient Hospital Stay: Admission: RE | Admit: 2012-03-31 | Payer: 59 | Source: Ambulatory Visit

## 2012-03-31 NOTE — Telephone Encounter (Signed)
I called patient and told her that Dr. Marina Goodell had reviewed her previous CT and said it looked good.  He said a new CT would not be necessary at this time and for her to keep taking her probiotic. I told her that, per Dr. Marina Goodell, if her symptoms returned or got worse we could look at rescheduling the CT at that time.  Patient understood and agreed

## 2012-03-31 NOTE — Telephone Encounter (Signed)
Misty Stanley from CT called and said patient realized that she had already had a CT abd/pel last February and wondered if the one Dr. Marina Goodell scheduled for today was necessary.  Misty Stanley faxed me a copy of the ct for Dr. Marina Goodell to review.

## 2012-05-26 ENCOUNTER — Encounter: Payer: 59 | Admitting: Gynecology

## 2012-05-29 ENCOUNTER — Encounter: Payer: Self-pay | Admitting: Gynecology

## 2012-06-02 ENCOUNTER — Telehealth: Payer: Self-pay | Admitting: *Deleted

## 2012-06-02 DIAGNOSIS — Z01419 Encounter for gynecological examination (general) (routine) without abnormal findings: Secondary | ICD-10-CM

## 2012-06-02 NOTE — Telephone Encounter (Signed)
Pt has annual schedule on 06/06/12 @ 2:00pm. She would like to come in on Monday 06/05/12 to have blood work drawn so she could come in fasting. Pt said you normally have TSH,CBC,U/A, FLP drawn. Okay to place order?

## 2012-06-02 NOTE — Telephone Encounter (Signed)
Pt informed, order placed, pt will come in on 07/07/11 @ 9:30 am

## 2012-06-02 NOTE — Telephone Encounter (Signed)
I would recommend CBC, comprehensive metabolic panel, lipid profile, TSH, vitamin D, urinalysis.

## 2012-06-05 ENCOUNTER — Other Ambulatory Visit: Payer: 59

## 2012-06-05 DIAGNOSIS — Z01419 Encounter for gynecological examination (general) (routine) without abnormal findings: Secondary | ICD-10-CM

## 2012-06-05 LAB — CBC
HCT: 43.5 % (ref 36.0–46.0)
Hemoglobin: 14.5 g/dL (ref 12.0–15.0)
MCH: 29 pg (ref 26.0–34.0)
MCHC: 33.3 g/dL (ref 30.0–36.0)
MCV: 87 fL (ref 78.0–100.0)
RBC: 5 MIL/uL (ref 3.87–5.11)

## 2012-06-06 ENCOUNTER — Ambulatory Visit (INDEPENDENT_AMBULATORY_CARE_PROVIDER_SITE_OTHER): Payer: 59 | Admitting: Gynecology

## 2012-06-06 ENCOUNTER — Other Ambulatory Visit (HOSPITAL_COMMUNITY)
Admission: RE | Admit: 2012-06-06 | Discharge: 2012-06-06 | Disposition: A | Discharge status: Home / Self Care | Payer: 59 | Source: Ambulatory Visit | Attending: Gynecology | Admitting: Gynecology

## 2012-06-06 ENCOUNTER — Encounter: Payer: Self-pay | Admitting: Gynecology

## 2012-06-06 VITALS — BP 120/76 | Ht 66.5 in | Wt 144.0 lb

## 2012-06-06 DIAGNOSIS — Z8669 Personal history of other diseases of the nervous system and sense organs: Secondary | ICD-10-CM

## 2012-06-06 DIAGNOSIS — N920 Excessive and frequent menstruation with regular cycle: Secondary | ICD-10-CM

## 2012-06-06 DIAGNOSIS — Z01419 Encounter for gynecological examination (general) (routine) without abnormal findings: Secondary | ICD-10-CM

## 2012-06-06 DIAGNOSIS — E039 Hypothyroidism, unspecified: Secondary | ICD-10-CM

## 2012-06-06 DIAGNOSIS — Z1151 Encounter for screening for human papillomavirus (HPV): Secondary | ICD-10-CM | POA: Insufficient documentation

## 2012-06-06 LAB — COMPREHENSIVE METABOLIC PANEL
Alkaline Phosphatase: 39 U/L (ref 39–117)
BUN: 23 mg/dL (ref 6–23)
CO2: 29 mEq/L (ref 19–32)
Creat: 0.81 mg/dL (ref 0.50–1.10)
Glucose, Bld: 85 mg/dL (ref 70–99)
Total Bilirubin: 0.6 mg/dL (ref 0.3–1.2)
Total Protein: 6.2 g/dL (ref 6.0–8.3)

## 2012-06-06 LAB — URINALYSIS W MICROSCOPIC + REFLEX CULTURE
Bilirubin Urine: NEGATIVE
Casts: NONE SEEN
Glucose, UA: NEGATIVE mg/dL
Leukocytes, UA: NEGATIVE
pH: 7 (ref 5.0–8.0)

## 2012-06-06 LAB — LIPID PANEL
Cholesterol: 188 mg/dL (ref 0–200)
HDL: 60 mg/dL (ref >39)
LDL Cholesterol: 109 mg/dL — ABNORMAL HIGH (ref 0–99)
Total CHOL/HDL Ratio: 3.1 Ratio
Triglycerides: 95 mg/dL (ref <150)
VLDL: 19 mg/dL (ref 0–40)

## 2012-06-06 MED ORDER — LEVOTHYROXINE SODIUM 125 MCG PO TABS: 125.0000 ug | ORAL_TABLET | Freq: Every day | ORAL | Status: DC

## 2012-06-06 MED ORDER — IBUPROFEN 800 MG PO TABS: 800.0000 mg | ORAL_TABLET | Freq: Three times a day (TID) | ORAL | Status: DC | PRN

## 2012-06-06 NOTE — Patient Instructions (Signed)
Follow up in one year for annual exam 

## 2012-06-06 NOTE — Progress Notes (Signed)
Sheila Strickland 1960/07/30 [id number]        51 y.o.  [id number] for annual exam.  Former patient of Dr. Eda Paschal  Past medical history,surgical history, medications, allergies, family history and social history were all reviewed and documented in the EPIC chart. ROS:  Was performed and pertinent positives and negatives are included in the history.  Exam: Biomedical scientist Filed Vitals:   06/06/12 1407  BP: 120/76  Height: 5' 6.5" (1.689 m)  Weight: 144 lb (65.318 kg)   General appearance  Normal Skin grossly normal Head/Neck normal with no cervical or supraclavicular adenopathy thyroid normal Lungs  clear Cardiac RR, without RMG Abdominal  soft, nontender, without masses, organomegaly or hernia Breasts  examined lying and sitting without masses, retractions, discharge or axillary adenopathy. Pelvic  Ext/BUS/vagina  normal   Cervix  normal Pap/HPV  Uterus  anteverted, normal size, shape and contour, midline and mobile nontender   Adnexa  Without masses or tenderness    Anus and perineum  normal   Rectovaginal  normal sphincter tone without palpated masses or tenderness.    Assessment/Plan:  51 y.o. [id number] female for annual exam, regular menses, vasectomy birth control.   1. Menorrhagia. Patient's periods tend to be heavy with several days of frequent pad changes. Recent hemoglobin was normal. Underwent recent hysteroscopy D&C with removal of benign endometrial polyp in September 2013 Dr. Eda Paschal. Is known to have 5 cm myoma although her exam overall feels normal. Options for management including expectant management awaiting menopause, hormone manipulation, Mirena IUD, endometrial ablation, hysterectomy reviewed. Patient plans expectant management at present.  No perimenopausal symptoms. We'll continue to monitor. Follow up sooner than one year if significant menstrual irregularity or menopausal symptoms present. 2. Pap smear 2012. Pap/HPV done today at patient request. Does have  history of CIN-1 status post LEEP 2008 with normal Pap smears since then. If this Pap is normal I discussed less frequent screening options every 3 years. 3. Mammography December 2013. Continue with annual mammography. SBE monthly reviewed. 4. Colonoscopy May 2013. Follow up with their recommended interval. 5. Hypothyroid. I refilled her Synthroid x1 year. Recent TSH normal. 6. Benign hematuria. Urinalysis does show blood. She is actively being followed by urology and has as appointed to see them in the several months. 7. History of migraine headaches. Usually takes OTC Advil.  Motrin 800 mg #30 with one refill provided. 8. Health maintenance. Baseline labs are normal with the exception of her hematuria. Follow up one year, sooner as needed.    Dara Lords MD, 2:29 PM 06/06/2012

## 2012-06-07 ENCOUNTER — Telehealth: Payer: Self-pay | Admitting: Gynecology

## 2012-06-07 MED ORDER — CIPROFLOXACIN HCL 250 MG PO TABS: 250.0000 mg | ORAL_TABLET | Freq: Two times a day (BID) | ORAL | Status: DC

## 2012-06-07 NOTE — Telephone Encounter (Signed)
Tell patient that her urine did grow out bacteria and I want to treat her with ciprofloxin 250 mg BID X 7

## 2012-06-08 ENCOUNTER — Other Ambulatory Visit: Payer: Self-pay | Admitting: Gynecology

## 2012-06-08 ENCOUNTER — Telehealth: Payer: Self-pay | Admitting: Gynecology

## 2012-06-08 LAB — URINE CULTURE: Colony Count: >=100000

## 2012-06-08 MED ORDER — CIPROFLOXACIN HCL 250 MG PO TABS: 250.0000 mg | ORAL_TABLET | Freq: Two times a day (BID) | ORAL | Status: DC

## 2012-06-08 NOTE — Telephone Encounter (Signed)
Patient informed. Rx sent 

## 2012-06-08 NOTE — Telephone Encounter (Signed)
Dr. Velvet Bathe had earlier this morning prescribed patient Cipro for UTI and e-scribed it to her mail-order pharmacy in error.  Patient would like to get it at local pharmacy and I e-scribed it there.  I called [facility] to cancel the RX there and explained situation. Rep said it was already being processed and she spoke with Marcelino Duster, Charity fundraiser, who sent an email to stop shipment of RX.  Said that they could not guarantee it but there was a good chance it would be cancelled.

## 2012-06-15 ENCOUNTER — Telehealth: Payer: Self-pay | Admitting: Gynecology

## 2012-06-15 DIAGNOSIS — N390 Urinary tract infection, site not specified: Secondary | ICD-10-CM

## 2012-06-15 NOTE — Telephone Encounter (Signed)
Ask patient to repeat a CC UA after antibiotic rx done to make sure blood in her urine clears.  I think it is due to the infection, but I want to make sure.

## 2012-06-16 NOTE — Telephone Encounter (Signed)
Pt informed with the below note, order placed. 

## 2012-06-16 NOTE — Telephone Encounter (Signed)
Left message for pt to call.

## 2012-06-19 ENCOUNTER — Other Ambulatory Visit: Payer: 59

## 2012-06-19 DIAGNOSIS — N390 Urinary tract infection, site not specified: Secondary | ICD-10-CM

## 2012-06-20 LAB — URINALYSIS W MICROSCOPIC + REFLEX CULTURE
Bacteria, UA: NONE SEEN
Bilirubin Urine: NEGATIVE
Casts: NONE SEEN
Crystals: NONE SEEN
Ketones, ur: NEGATIVE mg/dL
Specific Gravity, Urine: 1.01 (ref 1.005–1.030)
pH: 6 (ref 5.0–8.0)

## 2012-06-23 ENCOUNTER — Telehealth: Payer: Self-pay | Admitting: *Deleted

## 2012-06-23 NOTE — Telephone Encounter (Signed)
Pt informed with recent U/A results for 06/19/12.

## 2012-10-23 ENCOUNTER — Ambulatory Visit (INDEPENDENT_AMBULATORY_CARE_PROVIDER_SITE_OTHER): Payer: 59 | Admitting: Gynecology

## 2012-10-23 ENCOUNTER — Encounter: Payer: Self-pay | Admitting: Gynecology

## 2012-10-23 VITALS — BP 128/82

## 2012-10-23 DIAGNOSIS — R141 Gas pain: Secondary | ICD-10-CM

## 2012-10-23 DIAGNOSIS — R102 Pelvic and perineal pain: Secondary | ICD-10-CM

## 2012-10-23 DIAGNOSIS — R142 Eructation: Secondary | ICD-10-CM

## 2012-10-23 DIAGNOSIS — N949 Unspecified condition associated with female genital organs and menstrual cycle: Secondary | ICD-10-CM

## 2012-10-23 DIAGNOSIS — R140 Abdominal distension (gaseous): Secondary | ICD-10-CM

## 2012-10-23 DIAGNOSIS — D252 Subserosal leiomyoma of uterus: Secondary | ICD-10-CM

## 2012-10-23 LAB — URINALYSIS W MICROSCOPIC + REFLEX CULTURE
Nitrite: NEGATIVE
Protein, ur: NEGATIVE mg/dL
Urobilinogen, UA: 0.2 mg/dL (ref 0.0–1.0)

## 2012-10-23 NOTE — Progress Notes (Addendum)
50 patient was seen in the office on 06/06/2012 for annual exam  Presented today to the office complainingof three-day history of left lower quadrant discomfort. She continues to feel bloated gaseous like sensation. In 2013 had a resectoscopic polypectomy and also had been referred to the gastroenterologist because of similar complaints. Patient had a contrast enhanced CT scan of the abdomen and pelvis and it was described as she had small bilateral nephrolithiasis with no ureteral obstruction or ureteral lithiasis. Fibroid with once again described.Review of patient's records indicated no visit 2013 the month before her resectoscopic polypectomy that her ultrasound had demonstrated that she had a right subserosal fibroid that measured 5.0 x 4.4 x 4.5 cm with arterial blood flow seen. The right ovary was normal but there was a left ovarian thick wall cyst with internal low level echoes and measured 3.1 x 2.2 x 1.8 cm. The sonohysterogram had demonstrated an endometrial lesion which was removed resectoscopic lesion it was a benign endometrium along with benign polyp.  Patient states that her bowel movements are normal. She does have history of chronic microscopic hematuria whereby she's been evaluated by the urologist. Her menstrual cycles reported to be regular. Her husband has had a vasectomy. Patient denies any GU or GI complaints today. She denies any postcoital bleeding or any dyspareunia.  Urinalysis: RBC 11-20 and few bacteria   Exam: Abdomen soft slightly tender left lower quadrant but no rebound or guarding. Bowel sounds were present throughout the abdomen. Pelvic: Bartholin urethra Skene was within normal limits Vagina: No lesions or discharge Cervix no lesions or discharge Uterus anteverted irregular shaped ports patient's left fibroid? Adnexa: Fullness was noted in the left adnexa Rectal exam unremarkable  Assessment/plan: Patient with chronic bloating sensation and history of fibroid  uterus. Interestingly her discomfort is mostly in the left lower quadrant and on examination there was irregularity at the left fundal region could not discern if this was adnexal mass versus fibroid? Although the ultrasound reportedemonstrated that the subserous fibroma was on the right. Patient will be scheduled for an ultrasound next week and to followup with my colleague Dr. Audie Box who is her primary physician for additional followup. Patient with no acute distress today. Her urine cultures pending at time of this dictation.

## 2012-10-23 NOTE — Patient Instructions (Addendum)
Fibroids Fibroids are lumps (tumors) that can occur any place in a woman's body. These lumps are not cancerous. Fibroids vary in size, weight, and where they grow. HOME CARE  Do not take aspirin.  Write down the number of pads or tampons you use during your period. Tell your doctor. This can help determine the best treatment for you. GET HELP RIGHT AWAY IF:  You have pain in your lower belly (abdomen) that is not helped with medicine.  You have cramps that are not helped with medicine.  You have more bleeding between or during your period.  You feel lightheaded or pass out (faint).  Your lower belly pain gets worse. MAKE SURE YOU:  Understand these instructions.  Will watch your condition.  Will get help right away if you are not doing well or get worse. Document Released: 06/26/2010 Document Revised: 08/16/2011 Document Reviewed: 06/26/2010 [facility] Patient Information 2013 [place], [place].    Ovarian Cyst The ovaries are small organs that are on each side of the uterus. The ovaries are the organs that produce the female hormones, estrogen and progesterone. An ovarian cyst is a sac filled with fluid that can vary in its size. It is normal for a small cyst to form in women who are in the childbearing age and who have menstrual periods. This type of cyst is called a follicle cyst that becomes an ovulation cyst (corpus luteum cyst) after it produces the women's egg. It later goes away on its own if the woman does not become pregnant. There are other kinds of ovarian cysts that may cause problems and may need to be treated. The most serious problem is a cyst with cancer. It should be noted that menopausal women who have an ovarian cyst are at a higher risk of it being a cancer cyst. They should be evaluated very quickly, thoroughly and followed closely. This is especially true in menopausal women because of the high rate of ovarian cancer in women in menopause. CAUSES AND TYPES OF  OVARIAN CYSTS:  FUNCTIONAL CYST: The follicle/corpus luteum cyst is a functional cyst that occurs every month during ovulation with the menstrual cycle. They go away with the next menstrual cycle if the woman does not get pregnant. Usually, there are no symptoms with a functional cyst.  ENDOMETRIOMA CYST: This cyst develops from the lining of the uterus tissue. This cyst gets in or on the ovary. It grows every month from the bleeding during the menstrual period. It is also called a "chocolate cyst" because it becomes filled with blood that turns brown. This cyst can cause pain in the lower abdomen during intercourse and with your menstrual period.  CYSTADENOMA CYST: This cyst develops from the cells on the outside of the ovary. They usually are not cancerous. They can get very big and cause lower abdomen pain and pain with intercourse. This type of cyst can twist on itself, cut off its blood supply and cause severe pain. It also can easily rupture and cause a lot of pain.  DERMOID CYST: This type of cyst is sometimes found in both ovaries. They are found to have different kinds of body tissue in the cyst. The tissue includes skin, teeth, hair, and/or cartilage. They usually do not have symptoms unless they get very big. Dermoid cysts are rarely cancerous.  POLYCYSTIC OVARY: This is a rare condition with hormone problems that produces many small cysts on both ovaries. The cysts are follicle-like cysts that never produce an egg and become  a corpus luteum. It can cause an increase in body weight, infertility, acne, increase in body and facial hair and lack of menstrual periods or rare menstrual periods. Many women with this problem develop type 2 diabetes. The exact cause of this problem is unknown. A polycystic ovary is rarely cancerous.  THECA LUTEIN CYST: Occurs when too much hormone (human chorionic gonadotropin) is produced and over-stimulates the ovaries to produce an egg. They are frequently seen  when doctors stimulate the ovaries for invitro-fertilization (test tube babies).  LUTEOMA CYST: This cyst is seen during pregnancy. Rarely it can cause an obstruction to the birth canal during labor and delivery. They usually go away after delivery. SYMPTOMS   Pelvic pain or pressure.  Pain during sexual intercourse.  Increasing girth (swelling) of the abdomen.  Abnormal menstrual periods.  Increasing pain with menstrual periods.  You stop having menstrual periods and you are not pregnant. DIAGNOSIS  The diagnosis can be made during:  Routine or annual pelvic examination (common).  Ultrasound.  X-ray of the pelvis.  CT Scan.  MRI.  Blood tests. TREATMENT   Treatment may only be to follow the cyst monthly for 2 to 3 months with your caregiver. Many go away on their own, especially functional cysts.  May be aspirated (drained) with a long needle with ultrasound, or by laparoscopy (inserting a tube into the pelvis through a small incision).  The whole cyst can be removed by laparoscopy.  Sometimes the cyst may need to be removed through an incision in the lower abdomen.  Hormone treatment is sometimes used to help dissolve certain cysts.  Birth control pills are sometimes used to help dissolve certain cysts. HOME CARE INSTRUCTIONS  Follow your caregiver's advice regarding:  Medicine.  Follow up visits to evaluate and treat the cyst.  You may need to come back or make an appointment with another caregiver, to find the exact cause of your cyst, if your caregiver is not a gynecologist.  Get your yearly and recommended pelvic examinations and Pap tests.  Let your caregiver know if you have had an ovarian cyst in the past. SEEK MEDICAL CARE IF:   Your periods are late, irregular, they stop, or are painful.  Your stomach (abdomen) or pelvic pain does not go away.  Your stomach becomes larger or swollen.  You have pressure on your bladder or trouble emptying your  bladder completely.  You have painful sexual intercourse.  You have feelings of fullness, pressure, or discomfort in your stomach.  You lose weight for no apparent reason.  You feel generally ill.  You become constipated.  You lose your appetite.  You develop acne.  You have an increase in body and facial hair.  You are gaining weight, without changing your exercise and eating habits.  You think you are pregnant. SEEK IMMEDIATE MEDICAL CARE IF:   You have increasing abdominal pain.  You feel sick to your stomach (nausea) and/or vomit.  You develop a fever that comes on suddenly.  You develop abdominal pain during a bowel movement.  Your menstrual periods become heavier than usual. Document Released: 05/24/2005 Document Revised: 08/16/2011 Document Reviewed: 03/27/2009 [facility] Patient Information 2013 [place], [place].

## 2012-10-25 LAB — URINE CULTURE
Colony Count: NO GROWTH
Organism ID, Bacteria: NO GROWTH

## 2012-11-03 ENCOUNTER — Other Ambulatory Visit: Payer: 59

## 2012-11-03 ENCOUNTER — Encounter: Payer: Self-pay | Admitting: Gynecology

## 2012-11-03 ENCOUNTER — Ambulatory Visit (INDEPENDENT_AMBULATORY_CARE_PROVIDER_SITE_OTHER): Payer: 59 | Admitting: Gynecology

## 2012-11-03 ENCOUNTER — Ambulatory Visit (INDEPENDENT_AMBULATORY_CARE_PROVIDER_SITE_OTHER): Payer: 59

## 2012-11-03 DIAGNOSIS — R140 Abdominal distension (gaseous): Secondary | ICD-10-CM

## 2012-11-03 DIAGNOSIS — R143 Flatulence: Secondary | ICD-10-CM

## 2012-11-03 DIAGNOSIS — D259 Leiomyoma of uterus, unspecified: Secondary | ICD-10-CM

## 2012-11-03 LAB — FOLLICLE STIMULATING HORMONE: FSH: 6.3 m[IU]/mL

## 2012-11-03 NOTE — Patient Instructions (Signed)
Office will call you with hormone results. Call me if you want to pursue a more aggressive approach such as hysterectomy.

## 2012-11-03 NOTE — Progress Notes (Signed)
Patient presents for followup ultrasound. Had seen Dr. Lily Peer with abdominal bloating and was asked to schedule an ultrasound. She does have a history of known leiomyoma in the 5 cm range. Note that she's not having the bloating today.  Ultrasound shows uterus with single right-sided fibroid 57 x 50 mm. Right and left ovaries grossly normal, the right somewhat difficult to visualize. No free cul-de-sac fluid.  Assessment and plan: Abdominal bloating. I doubt related to the leiomyoma given the size which appear stable from prior studies. Ovaries appear normal without adnexal pathology visualized. Had colonoscopy last year. Recently started on probiotics per her gastroenterologist. We discussed the issues of her leiomyoma. Discussed possibility of leiomyosarcoma although highly unlikely. Issues of ovarian disease also reviewed but highly unlikely given negative ultrasound. Options up to including hysterectomy reviewed. Patient does not want to proceed with any intervention at this time would prefer to monitor see if the probiotics helps. I did recommend getting an FSH to see where we stand. She continues with regular monthly menses without intermenstrual bleeding. Patient will followup with these results. If she changes her mind and wants pursue a more aggressive approach then she'll call me.

## 2012-11-21 ENCOUNTER — Other Ambulatory Visit: Payer: Self-pay | Admitting: Anesthesiology

## 2012-11-21 DIAGNOSIS — Z1211 Encounter for screening for malignant neoplasm of colon: Secondary | ICD-10-CM

## 2012-12-11 ENCOUNTER — Other Ambulatory Visit: Payer: Self-pay | Admitting: Dermatology

## 2013-04-06 ENCOUNTER — Telehealth: Payer: Self-pay | Admitting: *Deleted

## 2013-04-06 MED ORDER — IBUPROFEN 800 MG PO TABS: 800.0000 mg | ORAL_TABLET | Freq: Three times a day (TID) | ORAL | Status: DC | PRN

## 2013-04-06 NOTE — Telephone Encounter (Signed)
Pt called requesting refill on motrin 800 mg that she uses for migraines. Pt has annual scheduled in Jan 2015.

## 2013-04-06 NOTE — Telephone Encounter (Signed)
Pt called back and would like rx sent to [facility], this was done.

## 2013-04-06 NOTE — Telephone Encounter (Signed)
Ibuprofen 800 mg #30 one by mouth every 8 hour when necessary pain. Refill x3

## 2013-04-06 NOTE — Telephone Encounter (Signed)
rx sent, left on pt voicemail this has been done. 

## 2013-04-06 NOTE — Addendum Note (Signed)
Addended by: Aura Camps on: 04/06/2013 03:07 PM   Modules accepted: Orders

## 2013-04-09 NOTE — Telephone Encounter (Signed)
rx at local pharmacy was canceled.

## 2013-05-29 ENCOUNTER — Encounter: Payer: Self-pay | Admitting: Gynecology

## 2013-06-05 ENCOUNTER — Other Ambulatory Visit: Payer: Self-pay | Admitting: *Deleted

## 2013-06-05 DIAGNOSIS — R928 Other abnormal and inconclusive findings on diagnostic imaging of breast: Secondary | ICD-10-CM

## 2013-06-06 ENCOUNTER — Encounter: Payer: Self-pay | Admitting: Gynecology

## 2013-06-11 ENCOUNTER — Other Ambulatory Visit: Payer: Self-pay | Admitting: Dermatology

## 2013-06-13 ENCOUNTER — Ambulatory Visit (INDEPENDENT_AMBULATORY_CARE_PROVIDER_SITE_OTHER): Payer: 59 | Admitting: Gynecology

## 2013-06-13 ENCOUNTER — Encounter: Payer: Self-pay | Admitting: Gynecology

## 2013-06-13 VITALS — BP 120/70 | Ht 67.0 in | Wt 147.0 lb

## 2013-06-13 DIAGNOSIS — R51 Headache: Secondary | ICD-10-CM

## 2013-06-13 DIAGNOSIS — D259 Leiomyoma of uterus, unspecified: Secondary | ICD-10-CM

## 2013-06-13 DIAGNOSIS — N920 Excessive and frequent menstruation with regular cycle: Secondary | ICD-10-CM

## 2013-06-13 DIAGNOSIS — E039 Hypothyroidism, unspecified: Secondary | ICD-10-CM

## 2013-06-13 DIAGNOSIS — Z01419 Encounter for gynecological examination (general) (routine) without abnormal findings: Secondary | ICD-10-CM

## 2013-06-13 LAB — COMPREHENSIVE METABOLIC PANEL
ALBUMIN: 4.4 g/dL (ref 3.5–5.2)
ALK PHOS: 45 U/L (ref 39–117)
ALT: 20 U/L (ref 0–35)
AST: 23 U/L (ref 0–37)
BILIRUBIN TOTAL: 1 mg/dL (ref 0.3–1.2)
BUN: 17 mg/dL (ref 6–23)
CO2: 31 meq/L (ref 19–32)
Calcium: 9 mg/dL (ref 8.4–10.5)
Chloride: 98 mEq/L (ref 96–112)
Creat: 0.74 mg/dL (ref 0.50–1.10)
GLUCOSE: 77 mg/dL (ref 70–99)
POTASSIUM: 4.1 meq/L (ref 3.5–5.3)
SODIUM: 132 meq/L — AB (ref 135–145)
TOTAL PROTEIN: 6.6 g/dL (ref 6.0–8.3)

## 2013-06-13 LAB — CBC WITH DIFFERENTIAL/PLATELET
BASOS PCT: 0 % (ref 0–1)
Basophils Absolute: 0 10*3/uL (ref 0.0–0.1)
EOS ABS: 0.1 10*3/uL (ref 0.0–0.7)
Eosinophils Relative: 1 % (ref 0–5)
HEMATOCRIT: 43.5 % (ref 36.0–46.0)
HEMOGLOBIN: 14.8 g/dL (ref 12.0–15.0)
LYMPHS ABS: 1.4 10*3/uL (ref 0.7–4.0)
Lymphocytes Relative: 29 % (ref 12–46)
MCH: 29.3 pg (ref 26.0–34.0)
MCHC: 34 g/dL (ref 30.0–36.0)
MCV: 86.1 fL (ref 78.0–100.0)
MONO ABS: 0.4 10*3/uL (ref 0.1–1.0)
MONOS PCT: 9 % (ref 3–12)
NEUTROS PCT: 61 % (ref 43–77)
Neutro Abs: 2.9 10*3/uL (ref 1.7–7.7)
Platelets: 190 10*3/uL (ref 150–400)
RBC: 5.05 MIL/uL (ref 3.87–5.11)
RDW: 13.3 % (ref 11.5–15.5)
WBC: 4.8 10*3/uL (ref 4.0–10.5)

## 2013-06-13 LAB — URINALYSIS W MICROSCOPIC + REFLEX CULTURE
BACTERIA UA: NONE SEEN
BILIRUBIN URINE: NEGATIVE
CASTS: NONE SEEN
CRYSTALS: NONE SEEN
Glucose, UA: NEGATIVE mg/dL
KETONES UR: NEGATIVE mg/dL
Leukocytes, UA: NEGATIVE
Nitrite: NEGATIVE
PH: 6.5 (ref 5.0–8.0)
Protein, ur: NEGATIVE mg/dL
SPECIFIC GRAVITY, URINE: 1.024 (ref 1.005–1.030)
Urobilinogen, UA: 0.2 mg/dL (ref 0.0–1.0)

## 2013-06-13 LAB — LIPID PANEL
CHOLESTEROL: 199 mg/dL (ref 0–200)
HDL: 69 mg/dL (ref >39)
LDL Cholesterol: 115 mg/dL — ABNORMAL HIGH (ref 0–99)
TRIGLYCERIDES: 74 mg/dL (ref <150)
Total CHOL/HDL Ratio: 2.9 Ratio
VLDL: 15 mg/dL (ref 0–40)

## 2013-06-13 LAB — TSH: TSH: 3.269 u[IU]/mL (ref 0.350–4.500)

## 2013-06-13 MED ORDER — IBUPROFEN 800 MG PO TABS: 800.0000 mg | ORAL_TABLET | Freq: Three times a day (TID) | ORAL | Status: DC | PRN

## 2013-06-13 MED ORDER — LEVOTHYROXINE SODIUM 125 MCG PO TABS: 125.0000 ug | ORAL_TABLET | Freq: Every day | ORAL | Status: DC

## 2013-06-13 NOTE — Progress Notes (Signed)
Sheila Strickland 12-12-1960 [id number]        53 y.o.  G2P2002 for annual exam.  Several issues noted below.  Past medical history,surgical history, problem list, medications, allergies, family history and social history were all reviewed and documented in the EPIC chart.  ROS:  Performed and pertinent positives and negatives are included in the history, assessment and plan .  Exam: Kim assistant Filed Vitals:   06/13/13 0953  BP: 120/70  Height: 5\' 7"  (1.702 m)  Weight: 147 lb (66.679 kg)   General appearance  Normal Skin grossly normal Head/Neck normal with no cervical or supraclavicular adenopathy thyroid normal Lungs  clear Cardiac RR, without RMG Abdominal  soft, nontender, without masses, organomegaly or hernia Breasts  examined lying and sitting without masses, retractions, discharge or axillary adenopathy. Pelvic  Ext/BUS/vagina  Normal   Cervix  Normal   Uterus  bulky, irregular contour, midline and mobile nontender   Adnexa  Without masses or tenderness    Anus and perineum  Normal   Rectovaginal  Normal sphincter tone without palpated masses or tenderness.    Assessment/Plan:  53 y.o. G67P2002 female for annual exam, regular menses, vasectomy birth control.   1. Menorrhagia/leiomyoma. Patient's menses remain heavy for the first 2 days. They are regular without intermenstrual bleeding. History of 5 cm leiomyoma. Had hysteroscopic D&C with removal of polyp by Dr. Cherylann Banas September 2013. Options for management reviewed and the patient elected observation period her uterus feels more bulky on exam today. She does note some more pelvic pressure. Had ultrasound in May which showed her 57 mm leiomyoma. Recommend repeat ultrasound now as uterus feels to be larger. Patient will schedule a followup for this. 2. Migraine headaches, long-term, stable. Patient uses Motrin 800 mg with good relief. #60 with 3 refills provided. 3. Left inguinal/groin pain. Nagging discomfort comes and  goes. Also feels some hip pain and she has an appointment to see orthopedics. Her groin exam is normal without evidence of hernia both lying and standing. She will followup with orthopedics. 4. Pap smear/HPV 05/2012 negative. No Pap smear done today. History of low-grade cervical dysplasia with LEEP 2008. Normal Pap smears since then. Plan repeat at 3-5 year interval. 5. Mammography 05/2013. Continue with annual mammography. SBE monthly reviewed. 6. Colonoscopy 2013. Repeat that they're recommend that interval. 7. DEXA never. We'll plan further into the menopause. Increase calcium vitamin D reviewed. 8. Hypothyroid. Check TSH today and refill her Synthroid 125 mcg x1 year. 9. Health maintenance. Baseline CBC, comprehensive metabolic panel, lipid profile, TSH, vitamin D, urinalysis ordered. Followup for ultrasound as scheduled.   Note: This document was prepared with digital dictation and possible smart phrase technology. Any transcriptional errors that result from this process are unintentional.   Anastasio Auerbach MD, 10:22 AM 06/13/2013

## 2013-06-13 NOTE — Patient Instructions (Signed)
Follow up for ultrasound as scheduled 

## 2013-06-14 ENCOUNTER — Other Ambulatory Visit: Payer: Self-pay | Admitting: Gynecology

## 2013-06-14 DIAGNOSIS — IMO0001 Reserved for inherently not codable concepts without codable children: Secondary | ICD-10-CM

## 2013-06-14 LAB — VITAMIN D 25 HYDROXY (VIT D DEFICIENCY, FRACTURES): Vit D, 25-Hydroxy: 35 ng/mL (ref 30–89)

## 2013-06-14 LAB — URINE CULTURE
Colony Count: NO GROWTH
Organism ID, Bacteria: NO GROWTH

## 2013-06-18 ENCOUNTER — Encounter: Payer: Self-pay | Admitting: Gynecology

## 2013-06-18 ENCOUNTER — Ambulatory Visit (INDEPENDENT_AMBULATORY_CARE_PROVIDER_SITE_OTHER): Payer: 59 | Admitting: Gynecology

## 2013-06-18 ENCOUNTER — Other Ambulatory Visit: Payer: Self-pay | Admitting: Gynecology

## 2013-06-18 ENCOUNTER — Ambulatory Visit (INDEPENDENT_AMBULATORY_CARE_PROVIDER_SITE_OTHER): Payer: 59

## 2013-06-18 DIAGNOSIS — D251 Intramural leiomyoma of uterus: Secondary | ICD-10-CM

## 2013-06-18 DIAGNOSIS — N852 Hypertrophy of uterus: Secondary | ICD-10-CM

## 2013-06-18 DIAGNOSIS — D259 Leiomyoma of uterus, unspecified: Secondary | ICD-10-CM

## 2013-06-18 DIAGNOSIS — N831 Corpus luteum cyst of ovary, unspecified side: Secondary | ICD-10-CM

## 2013-06-18 DIAGNOSIS — E871 Hypo-osmolality and hyponatremia: Secondary | ICD-10-CM

## 2013-06-18 LAB — ELECTROLYTE PANEL
CO2: 27 meq/L (ref 19–32)
Chloride: 103 mEq/L (ref 96–112)
Potassium: 4.6 mEq/L (ref 3.5–5.3)
Sodium: 136 mEq/L (ref 135–145)

## 2013-06-18 NOTE — Patient Instructions (Signed)
Office will call you with blood results. Followup in 4-6 months for reexamination. Sooner if you decide to pursue active treatment.

## 2013-06-18 NOTE — Progress Notes (Signed)
Patient presents for followup ultrasound. History of menorrhagia with pelvic pressure. Known leiomyoma measuring approximately 53 mm 10/2012. Exam felt like leiomyoma was getting larger and recommended ultrasound.  Ultrasound shows enlarged uterus with single myoma 72 mm mean. Endometrial echo 18 mm. Mid cycle now. Right and left ovaries visualized with physiologic changes. Cul-de-sac negative.  Assessment and plan: Myoma slightly enlarged from last ultrasound. Symptoms are primarily for the first 2 days of her menses although the pelvic pressure seems to be more constant. Difficult to separate from orthopedic issues having hip pain also and is in the process of seeing an orthopedist. Options for management include observation, hormonal intervention to include Depo-Lupron suppression given that she is 52 and we may be able to transition her into the menopause. Uterine artery embolization, myomectomy and hysterectomy were also reviewed. At this point the patient prefers observation. I did recommend reexamination in 4-6 months to make sure that her myoma is not significantly enlarging. Check baseline FSH now. She was hyponatremic at 132 with her last blood check and we'll go ahead and repeat that also. I also discussed her marginal LDL at 115.

## 2013-06-19 LAB — FOLLICLE STIMULATING HORMONE: FSH: 3.9 m[IU]/mL

## 2013-11-02 ENCOUNTER — Telehealth: Payer: Self-pay | Admitting: *Deleted

## 2013-11-02 DIAGNOSIS — D219 Benign neoplasm of connective and other soft tissue, unspecified: Secondary | ICD-10-CM

## 2013-11-02 NOTE — Telephone Encounter (Signed)
ORDER PLACED, PT TRANSFERRED TO FRONT DESK.

## 2013-11-02 NOTE — Telephone Encounter (Signed)
Pt was told to follow up ini 4-6 months for reexamination from [place] 06/18/13 due to Myoma. Pt just wanted to confirm that ultrasound will done that day? No order in system. Please advise

## 2013-11-02 NOTE — Telephone Encounter (Signed)
Yes to scheduling ultrasound at that visit.

## 2013-11-05 ENCOUNTER — Other Ambulatory Visit: Payer: Self-pay | Admitting: Gynecology

## 2013-11-05 ENCOUNTER — Telehealth: Payer: Self-pay

## 2013-11-05 MED ORDER — SULFAMETHOXAZOLE-TMP DS 800-160 MG PO TABS: 1.0000 | ORAL_TABLET | Freq: Two times a day (BID) | ORAL | Status: DC

## 2013-11-05 NOTE — Telephone Encounter (Signed)
Patient complaining of UTI sx.  Burning and frequency.  States she has seen urologist in last several mos for follow-up.  Wonders if Dr. Loetta Rough would be willing to treat her UTI?

## 2013-11-05 NOTE — Telephone Encounter (Signed)
Septra DS 1 by mouth twice a day x3 days 

## 2013-11-05 NOTE — Telephone Encounter (Signed)
Patient informed. Rx sent 

## 2013-11-07 ENCOUNTER — Ambulatory Visit: Payer: 59 | Admitting: Gynecology

## 2013-11-13 ENCOUNTER — Telehealth: Payer: Self-pay | Admitting: *Deleted

## 2013-11-13 DIAGNOSIS — R3915 Urgency of urination: Secondary | ICD-10-CM

## 2013-11-13 NOTE — Telephone Encounter (Signed)
I would recommend repeating a clean catch urinalysis

## 2013-11-13 NOTE — Telephone Encounter (Signed)
Pt informed, order placed. Pt will call to make lab appointment.

## 2013-11-13 NOTE — Telephone Encounter (Signed)
Pt was given Rx for Septra DS 1 by mouth twice a day x3 days on 11/05/13, pt feel better with burning, but still having slight frequency. Has ultasound scheduled on 11/28/13 asked if you would refill Rx.? Please advise

## 2013-11-14 ENCOUNTER — Ambulatory Visit: Payer: 59

## 2013-11-14 DIAGNOSIS — R3915 Urgency of urination: Secondary | ICD-10-CM

## 2013-11-15 ENCOUNTER — Other Ambulatory Visit: Payer: Self-pay | Admitting: Gynecology

## 2013-11-15 DIAGNOSIS — N390 Urinary tract infection, site not specified: Secondary | ICD-10-CM

## 2013-11-15 LAB — URINALYSIS W MICROSCOPIC + REFLEX CULTURE
BILIRUBIN URINE: NEGATIVE
Casts: NONE SEEN
Crystals: NONE SEEN
Glucose, UA: NEGATIVE mg/dL
Ketones, ur: NEGATIVE mg/dL
Nitrite: NEGATIVE
PROTEIN: NEGATIVE mg/dL
SQUAMOUS EPITHELIAL / LPF: NONE SEEN
Specific Gravity, Urine: 1.021 (ref 1.005–1.030)
Urobilinogen, UA: 0.2 mg/dL (ref 0.0–1.0)
pH: 5.5 (ref 5.0–8.0)

## 2013-11-15 MED ORDER — CIPROFLOXACIN HCL 250 MG PO TABS: 250.0000 mg | ORAL_TABLET | Freq: Two times a day (BID) | ORAL | Status: DC

## 2013-11-17 LAB — URINE CULTURE

## 2013-11-28 ENCOUNTER — Other Ambulatory Visit: Payer: Self-pay | Admitting: Gynecology

## 2013-11-28 ENCOUNTER — Ambulatory Visit (INDEPENDENT_AMBULATORY_CARE_PROVIDER_SITE_OTHER): Payer: 59

## 2013-11-28 ENCOUNTER — Telehealth: Payer: Self-pay | Admitting: *Deleted

## 2013-11-28 ENCOUNTER — Ambulatory Visit (INDEPENDENT_AMBULATORY_CARE_PROVIDER_SITE_OTHER): Payer: 59 | Admitting: Gynecology

## 2013-11-28 ENCOUNTER — Encounter: Payer: Self-pay | Admitting: Gynecology

## 2013-11-28 DIAGNOSIS — D259 Leiomyoma of uterus, unspecified: Secondary | ICD-10-CM

## 2013-11-28 DIAGNOSIS — D252 Subserosal leiomyoma of uterus: Secondary | ICD-10-CM

## 2013-11-28 DIAGNOSIS — D251 Intramural leiomyoma of uterus: Secondary | ICD-10-CM

## 2013-11-28 DIAGNOSIS — D219 Benign neoplasm of connective and other soft tissue, unspecified: Secondary | ICD-10-CM

## 2013-11-28 DIAGNOSIS — IMO0002 Reserved for concepts with insufficient information to code with codable children: Secondary | ICD-10-CM

## 2013-11-28 DIAGNOSIS — N390 Urinary tract infection, site not specified: Secondary | ICD-10-CM

## 2013-11-28 DIAGNOSIS — N852 Hypertrophy of uterus: Secondary | ICD-10-CM

## 2013-11-28 LAB — URINALYSIS W MICROSCOPIC + REFLEX CULTURE
BACTERIA UA: NONE SEEN
Bilirubin Urine: NEGATIVE
CASTS: NONE SEEN
CRYSTALS: NONE SEEN
Glucose, UA: NEGATIVE mg/dL
KETONES UR: NEGATIVE mg/dL
Leukocytes, UA: NEGATIVE
Nitrite: NEGATIVE
PH: 5.5 (ref 5.0–8.0)
Protein, ur: NEGATIVE mg/dL
Specific Gravity, Urine: 1.015 (ref 1.005–1.030)
Urobilinogen, UA: 0.2 mg/dL (ref 0.0–1.0)
WBC, UA: NONE SEEN WBC/hpf (ref <3)

## 2013-11-28 NOTE — Progress Notes (Signed)
Sheila Strickland 01/21/1961 [id number]        53 y.o.  [id number] presents for followup ultrasound. History of leiomyomata with one large myoma measuring 53 mm 10/2012. Followup ultrasound 06/2013 measured 72 mm. Has heavy flow for the first 2 days of her cycle but then otherwise manageable. Also most recently has developed discomfort with intercourse. Also has a past history of endometrial polyp resection 2013. Was recommended to repeat the ultrasound with exam and a 4 to six month interval and she presents now for ultrasound. FSH was checked in January and was 3.9. She notes her mother went to relate menopause.  Past medical history,surgical history, problem list, medications, allergies, family history and social history were all reviewed and documented in the EPIC chart.  Directed ROS with pertinent positives and negatives documented in the history of present illness/assessment and plan.  Ultrasound shows uterus enlarged with a single large myoma measuring 84 mm mean. Right and left ovaries normal. Cul-de-sac negative. Endometrial echo 17 mm noting she is midcycle but there is a echogenic area 13 x 11 mm avascular within the endometrial echo.  Exam: Sheila Strickland assistant General appearance  Normal Abdomen soft nontender without masses guarding rebound Pelvic external BUS vagina normal. Uterus bulky filling the pelvis. Nontender. Adnexa without gross masses or tenderness.  Assessment/Plan:  53 y.o. G2P2002 enlarging single myoma with heavy menses first several days. Now with discomfort with intercourse. Questionable endometrial polyp with history of endometrial polyps in the past. I reviewed options previously 06/18/2013 note. I again discussed her options include observation with repeat sonohysterogram in several months to evaluate for the polyp and myoma stability keeping a pain calendar and intercourse monitoring. Hormonal manipulation to include possible Depo-Lupron suppression reviewed. Does not appear to  be anywhere near menopause at this point with a normal FSH and her mother's history of late menopause.  Uterine artery embolization, myomectomy and ultimate hysterectomy reviewed. Although her uterus is bulky she has had 2 vaginal deliveries I think a vaginal approach either LAVH versus TVH attempt would be reasonable. The ovarian conservation if she was also discussed particularly in a 53 year old female and whether keeping her ovaries would benefit her from a transition into menopause standpoint weighed against the risk of ovarian cancer or ovarian disease in the future. The issues of risk reductive salpingectomy also discussed if she wanted to keep her ovaries. Patient wants to think of all of her options and will call with her decision. She does understand her no guarantees as far as a vaginal approach and I may convert her to an abdominal hysterectomy in time if it is felt safer to proceed intraoperatively or if complications arise. Which involved generally with the surgery to include the expected recovery period was also reviewed.   Note: This document was prepared with digital dictation and possible smart phrase technology. Any transcriptional errors that result from this process are unintentional.   Anastasio Auerbach MD, 11:19 AM 11/28/2013

## 2013-11-28 NOTE — Patient Instructions (Signed)
followup with your decision as far as your fibroids.

## 2013-11-28 NOTE — Telephone Encounter (Signed)
Pt was seen today to discuss ultrasound, she would like to proceed with hysterectomy. I will forward to [facility] to inform.

## 2013-11-28 NOTE — Telephone Encounter (Signed)
Request slip sent to [facility]

## 2013-11-29 LAB — URINE CULTURE
Colony Count: NO GROWTH
Organism ID, Bacteria: NO GROWTH

## 2013-11-30 ENCOUNTER — Encounter (HOSPITAL_COMMUNITY): Payer: Self-pay | Admitting: Gynecology

## 2013-12-05 DIAGNOSIS — D6851 Activated protein C resistance: Secondary | ICD-10-CM

## 2013-12-05 HISTORY — DX: Activated protein C resistance: D68.51

## 2013-12-13 ENCOUNTER — Encounter (HOSPITAL_COMMUNITY): Payer: Self-pay | Admitting: Pharmacist

## 2013-12-14 ENCOUNTER — Encounter: Payer: Self-pay | Admitting: Gynecology

## 2013-12-14 ENCOUNTER — Ambulatory Visit (INDEPENDENT_AMBULATORY_CARE_PROVIDER_SITE_OTHER): Payer: 59 | Admitting: Gynecology

## 2013-12-14 DIAGNOSIS — R102 Pelvic and perineal pain: Secondary | ICD-10-CM

## 2013-12-14 DIAGNOSIS — N920 Excessive and frequent menstruation with regular cycle: Secondary | ICD-10-CM

## 2013-12-14 DIAGNOSIS — D251 Intramural leiomyoma of uterus: Secondary | ICD-10-CM

## 2013-12-14 DIAGNOSIS — N949 Unspecified condition associated with female genital organs and menstrual cycle: Secondary | ICD-10-CM

## 2013-12-14 NOTE — Patient Instructions (Signed)
Followup for surgery as scheduled. 

## 2013-12-14 NOTE — Progress Notes (Signed)
Sheila Strickland January 20, 1961 [id number]   Preoperative consult  Chief complaint: Leiomyoma, menorrhagia, pelvic pain, recurrent endometrial polyp  History of present illness: 53 y.o. G2P2002 vasectomy birth control with history of menorrhagia for several days beginning each month. Single myoma noted at 53 mm 2014. In 2015 measures 72 mm with development of pelvic pressure and pain with intercourse symptoms. FSH 3.9. Ultrasound also suspicious for endometrial polyp with a history of hysteroscopic resection of endometrial polyps 2013. Options for management to include conservative with observation versus interventional with hormonal manipulation, repeat hysteroscopy, IUD, uterine artery embolization, myomectomy, hysterectomy were reviewed and the patient elects for hysterectomy. Patient is admitted for LAVH BSO.  Past medical history,surgical history, medications, allergies, family history and social history were all reviewed and documented in the EPIC chart. ROS:  Was performed and pertinent positives and negatives are included in the history of present illness.  Exam:  Kim assistant General: well developed, well nourished female, no acute distress HEENT: normal  Lungs: clear to auscultation without wheezing, rales or rhonchi  Cardiac: regular rate without rubs, murmurs or gallops  Abdomen: soft, nontender without masses, guarding, rebound, organomegaly  Pelvic: external bus vagina: normal   Cervix: grossly normal  Uterus: Bulky filling the pelvis.  Adnexa: without masses or tenderness    Assessment/Plan:  53 y.o. [id number] with menorrhagia, enlarging leiomyoma, probable recurrent endometrial polyp with symptoms of pelvic pressure and discomfort as well as dyspareunia. Options reviewed and patient desires LAVH BSO. I reviewed the proposed surgery with the patient to include the expected intraoperative/postoperative courses and the recovery period. She understands that we will attempt a  minimalistic approach but at any time during the surgery if unexpected findings or complications occur that we will convert to a TAH with a larger incision, more pain and a longer recovery period. The ovarian conservation issue was also reviewed with her and the patient has done a lot of reading and talking to friends and is struggling with the decision as far as removing or keeping both ovaries. We discussed in detail the risks/benefits of both keeping her ovaries and removing her ovaries. The benefits of ongoing hormone production and a smoother transition through menopause as well as protective affects from a bone health and cardiovascular standpoint weighed against the risk of ovarian disease in the future such as ovarian cancer were reviewed. The issues of HRT was also discussed to include the WHI study with increased risks of stroke heart attack DVT and breast cancer. Risk reductive salpingectomy with leaving the ovaries also discussed. Ultimately after a lengthy discussion the patient has decided that she wants to proceed with removing both ovaries and fallopian tubes and would consider HRT following the hysterectomy. Sexuality following hysterectomy was also reviewed and the risks of persistent orgasmic dysfunction and persistent dyspareunia discussed. The absolute irreversible sterility associated with hysterectomy was also reviewed. The risks of infection requiring prolonged antibiotics as well as abscess/hematoma development requiring reoperation and drainage were reviewed. The risk of hemorrhage necessitating transfusion and the risks of transfusion discussed to include transfusion reaction, hepatitis, HIV, mad cow disease and other unknown entities. Incisional complications requiring opening and draining of incisions, closure by secondary intention, long-term issues of keloid/cosmetics as well as hernia formation was discussed. The risks of inadvertent injury to internal organs including bowel, bladder,  ureters, vessels, nerves either immediately recognized or delay recognized with major exploratory reparative surgeries and future reparative surgeries including bowel resection, ostomy formation, bladder and ureteral damage repair surgeries all discussed  understood and accepted. The patient's questions were answered to her satisfaction she is ready to proceed with surgery.    Note: This document was prepared with digital dictation and possible smart phrase technology. Any transcriptional errors that result from this process are unintentional.  Anastasio Auerbach MD, 11:00 AM 12/14/2013

## 2013-12-14 NOTE — H&P (Signed)
Sheila Strickland 26-Mar-1961 [id number]   History and Physical  Chief complaint: Leiomyoma, menorrhagia, pelvic pain, recurrent endometrial polyp  History of present illness: 53 y.o. G2P2002 vasectomy birth control with history of menorrhagia for several days beginning each month. Single myoma noted at 53 mm 2014. In 2015 measures 72 mm with development of pelvic pressure and pain with intercourse symptoms. FSH 3.9. Ultrasound also suspicious for endometrial polyp with a history of hysteroscopic resection of endometrial polyps 2013. Options for management to include conservative with observation versus interventional with hormonal manipulation, repeat hysteroscopy, IUD, uterine artery embolization, myomectomy, hysterectomy were reviewed and the patient elects for hysterectomy. Patient is admitted for LAVH BSO.  Past medical history,surgical history, medications, allergies, family history and social history were all reviewed and documented in the EPIC chart. ROS:  Was performed and pertinent positives and negatives are included in the history of present illness.  Exam:  Kim assistant General: well developed, well nourished female, no acute distress HEENT: normal  Lungs: clear to auscultation without wheezing, rales or rhonchi  Cardiac: regular rate without rubs, murmurs or gallops  Abdomen: soft, nontender without masses, guarding, rebound, organomegaly  Pelvic: external bus vagina: normal   Cervix: grossly normal  Uterus: Bulky filling the pelvis.  Adnexa: without masses or tenderness    Assessment/Plan:  53 y.o. [id number] with menorrhagia, enlarging leiomyoma, probable recurrent endometrial polyp with symptoms of pelvic pressure and discomfort as well as dyspareunia. Options reviewed and patient desires LAVH BSO. I reviewed the proposed surgery with the patient to include the expected intraoperative/postoperative courses and the recovery period. She understands that we will attempt a  minimalistic approach but at any time during the surgery if unexpected findings or complications occur that we will convert to a TAH with a larger incision, more pain and a longer recovery period. The ovarian conservation issue was also reviewed with her and the patient has done a lot of reading and talking to friends and is struggling with the decision as far as removing or keeping both ovaries. We discussed in detail the risks/benefits of both keeping her ovaries and removing her ovaries. The benefits of ongoing hormone production and a smoother transition through menopause as well as protective affects from a bone health and cardiovascular standpoint weighed against the risk of ovarian disease in the future such as ovarian cancer were reviewed. The issues of HRT was also discussed to include the WHI study with increased risks of stroke heart attack DVT and breast cancer. Risk reductive salpingectomy with leaving the ovaries also discussed. Ultimately after a lengthy discussion the patient has decided that she wants to proceed with removing both ovaries and fallopian tubes and would consider HRT following the hysterectomy. Sexuality following hysterectomy was also reviewed and the risks of persistent orgasmic dysfunction and persistent dyspareunia discussed. The absolute irreversible sterility associated with hysterectomy was also reviewed. The risks of infection requiring prolonged antibiotics as well as abscess/hematoma development requiring reoperation and drainage were reviewed. The risk of hemorrhage necessitating transfusion and the risks of transfusion discussed to include transfusion reaction, hepatitis, HIV, mad cow disease and other unknown entities. Incisional complications requiring opening and draining of incisions, closure by secondary intention, long-term issues of keloid/cosmetics as well as hernia formation was discussed. The risks of inadvertent injury to internal organs including bowel, bladder,  ureters, vessels, nerves either immediately recognized or delay recognized with major exploratory reparative surgeries and future reparative surgeries including bowel resection, ostomy formation, bladder and ureteral damage repair surgeries all  discussed understood and accepted. The patient's questions were answered to her satisfaction she is ready to proceed with surgery.   Note: This document was prepared with digital dictation and possible smart phrase technology. Any transcriptional errors that result from this process are unintentional.  Anastasio Auerbach MD, 11:19 AM 12/14/2013

## 2013-12-17 ENCOUNTER — Telehealth: Payer: Self-pay

## 2013-12-17 NOTE — Telephone Encounter (Signed)
Patient to have hysterectomy next week. She asked if you could add to her pre op labwork at hospital at [place] tests.  She said her mom and her aunt have this and it might be important to know if she does.

## 2013-12-17 NOTE — Patient Instructions (Addendum)
   Your procedure is scheduled on:  Tuesday, July 21  Enter through the [employer] [facility] at:  [place] up the phone at the desk and dial [phone] and inform us of your arrival.  Please call this number if you have any problems the morning of surgery: [phone]  Remember: Do not eat food after midnight: Monday Do not drink clear liquids after: 9 AM Tuesday, Day of Surgery Take these medicines the morning of surgery with a SIP OF WATER:  synthroid  Do not wear jewelry, make-up, or FINGER nail polish No metal in your hair or on your body. Do not wear lotions, powders, perfumes.  You may wear deodorant.  Do not bring valuables to the hospital. Contacts, dentures or bridgework may not be worn into surgery.  Leave suitcase in the car. After Surgery it may be brought to your room. For patients being admitted to the hospital, checkout time is 11:00am the day of discharge.  Home with husband Truddie Crumble cell [phone].  Patients discharged on the day of surgery will not be allowed to drive home.

## 2013-12-17 NOTE — Telephone Encounter (Signed)
Needs to come tomorrow for test.  I need to know before surgery because I will cover her with pre op Lovenox if positive.  Pre op bloods will be too late to get results back.

## 2013-12-18 ENCOUNTER — Other Ambulatory Visit: Payer: 59

## 2013-12-18 ENCOUNTER — Telehealth: Payer: Self-pay

## 2013-12-18 ENCOUNTER — Other Ambulatory Visit: Payer: Self-pay | Admitting: Gynecology

## 2013-12-18 DIAGNOSIS — Z832 Family history of diseases of the blood and blood-forming organs and certain disorders involving the immune mechanism: Secondary | ICD-10-CM

## 2013-12-18 NOTE — Telephone Encounter (Signed)
Just as long as the results are back before surgery.  Check with lab to see how fast the results come back and if need be expedite it.

## 2013-12-18 NOTE — Telephone Encounter (Addendum)
She is having pre op labwork done at [facility] on Weds 7/15 (tomorrow) 10:30am and wanted you to add it to that.  Will that work?

## 2013-12-18 NOTE — Telephone Encounter (Signed)
Dr. Theodore Demark, CSR at [facility] around time for this test is 3 days. Not a stat test. We can request a [facility] but no guarantee.  I called patient and suggested she come now and get it drawn.  Even if she does it could be late Friday or the weekend before you receive results.  I am waiting to hear from patient.

## 2013-12-18 NOTE — Telephone Encounter (Signed)
Patient called to say that she could not make it to the office this afternoon to have Leiden Factor V drawn.  She said she would be at [facility] at 10:30am in the morning.  I called Marcie Bal, RN and left message on her voice mail that Dr. Loetta Rough on vacation and only hearing from him sporadically.  I explained I would send note to Dr. Loetta Rough and ask him to add the pre op order but if he did not receive it in time would there be any way they might be able to add it so patient can have it in the morning.

## 2013-12-19 ENCOUNTER — Encounter (HOSPITAL_COMMUNITY)
Admission: RE | Admit: 2013-12-19 | Discharge: 2013-12-19 | Disposition: A | Discharge status: Home / Self Care | Payer: 59 | Source: Ambulatory Visit | Attending: Gynecology | Admitting: Gynecology

## 2013-12-19 ENCOUNTER — Telehealth: Payer: Self-pay

## 2013-12-19 ENCOUNTER — Encounter (HOSPITAL_COMMUNITY): Payer: Self-pay

## 2013-12-19 DIAGNOSIS — Z01818 Encounter for other preprocedural examination: Secondary | ICD-10-CM | POA: Insufficient documentation

## 2013-12-19 DIAGNOSIS — Z01812 Encounter for preprocedural laboratory examination: Secondary | ICD-10-CM | POA: Insufficient documentation

## 2013-12-19 LAB — CBC
HCT: 42.8 % (ref 36.0–46.0)
HEMOGLOBIN: 14.1 g/dL (ref 12.0–15.0)
MCH: 29.5 pg (ref 26.0–34.0)
MCHC: 32.9 g/dL (ref 30.0–36.0)
MCV: 89.5 fL (ref 78.0–100.0)
Platelets: 151 10*3/uL (ref 150–400)
RBC: 4.78 MIL/uL (ref 3.87–5.11)
RDW: 12.7 % (ref 11.5–15.5)
WBC: 4.1 10*3/uL (ref 4.0–10.5)

## 2013-12-19 NOTE — Telephone Encounter (Signed)
Patient had pre op appt at [facility] this am.  She said that op permit stated LAVH but that when she talked to you at last visit you told her you were going to try to do this vaginally and not have any incisions.

## 2013-12-20 NOTE — Telephone Encounter (Signed)
We discussed and patient understands and is fine with this.

## 2013-12-20 NOTE — Telephone Encounter (Signed)
We discussed and plan on LAVH.  I told her I would try not to do a TAH with a large incision.

## 2013-12-23 LAB — FACTOR 5 LEIDEN

## 2013-12-24 ENCOUNTER — Other Ambulatory Visit: Payer: Self-pay | Admitting: Gynecology

## 2013-12-24 ENCOUNTER — Encounter (HOSPITAL_COMMUNITY): Payer: Self-pay | Admitting: Gynecology

## 2013-12-24 ENCOUNTER — Telehealth: Payer: Self-pay

## 2013-12-24 ENCOUNTER — Telehealth: Payer: Self-pay | Admitting: Gynecology

## 2013-12-24 MED ORDER — ENOXAPARIN SODIUM 40 MG/0.4ML ~~LOC~~ SOLN: 40.0000 mg | SUBCUTANEOUS | Status: DC

## 2013-12-24 MED ORDER — ENOXAPARIN SODIUM 40 MG/0.4ML ~~LOC~~ SOLN
40.0000 mg | SUBCUTANEOUS | Status: DC
  Filled 2013-12-24: qty 0.4

## 2013-12-24 MED ORDER — DEXTROSE 5 % IV SOLN
2.0000 g | INTRAVENOUS | Status: AC
  Administered 2013-12-25: 2 g via INTRAVENOUS
  Filled 2013-12-24: qty 2

## 2013-12-24 NOTE — Telephone Encounter (Signed)
Dr. Phineas Real asked me to call preadmitting nurse and make sure they received the order he entered for Lovenox two hours preop tomorrow before surgery.  I spoke with Hassan Rowan, RN and she made note of it and said she will take care of it.  Also, I sent order to pharmacy, per Dr. Loetta Rough, for Lovenox 40 mg daily for two weeks post op.

## 2013-12-24 NOTE — Telephone Encounter (Signed)
Called patient this morning in followup of her Leiden factor V mutation which did return positive heterologous. Her aunt apparently had a mini stroke and ultimately was checked for Leiden factor and found to be positive. The patient's mother was subsequently checked and found to be positive. The patient has had no history of thrombosis. Had been on oral contraceptives during her lifetime as well as pregnancies. I reviewed two issues with her. The first is her increased risk of potential thrombosis in the perioperative time frame. I did recommend that we prophylax her with Lovenox 40 mg subcutaneously daily for 2 weeks in the perioperative period and she agrees with this. Second issue is her ovarian conservation questioned. We have planned on removing her ovaries at the time of hysterectomy. She was then to start on ERT afterwards. I discussed with the Leiden factor V mutation she may be at a higher risk of thrombosis associated with ERT. The issue is whether to keep her ovaries for continuing estrogen production and a more natural transition to menopause excepting the lifetime risk of ovarian cancer versus removing her ovaries now and considering ERT in the form of patch. The issues of transdermal advantage over oral was reviewed with her.  By leaving her ovaries she also understands there is no guarantee that she would not require ERT in the future for symptoms. After a lengthy discussion the patient at this point wants to have both ovaries removed along with her hysterectomy and will plan on low dose transdermal ERT afterwards. I also recommended long-term low-dose aspirin daily as a prophylactic measure against thrombosis once we are through the surgery.

## 2013-12-25 ENCOUNTER — Ambulatory Visit (HOSPITAL_COMMUNITY): Payer: 59 | Admitting: Certified Registered"

## 2013-12-25 ENCOUNTER — Encounter (HOSPITAL_COMMUNITY): Payer: 59 | Admitting: Certified Registered"

## 2013-12-25 ENCOUNTER — Observation Stay (HOSPITAL_COMMUNITY)
Admission: RE | Admit: 2013-12-25 | Discharge: 2013-12-26 | Disposition: A | Discharge status: Home / Self Care | Payer: 59 | Source: Ambulatory Visit | Attending: Gynecology | Admitting: Gynecology

## 2013-12-25 ENCOUNTER — Encounter (HOSPITAL_COMMUNITY)
Admission: RE | Disposition: A | Discharge status: Home / Self Care | Payer: Self-pay | Source: Ambulatory Visit | Attending: Gynecology

## 2013-12-25 ENCOUNTER — Encounter (HOSPITAL_COMMUNITY): Payer: Self-pay | Admitting: *Deleted

## 2013-12-25 DIAGNOSIS — N840 Polyp of corpus uteri: Secondary | ICD-10-CM | POA: Diagnosis not present

## 2013-12-25 DIAGNOSIS — N949 Unspecified condition associated with female genital organs and menstrual cycle: Secondary | ICD-10-CM

## 2013-12-25 DIAGNOSIS — IMO0002 Reserved for concepts with insufficient information to code with codable children: Secondary | ICD-10-CM | POA: Insufficient documentation

## 2013-12-25 DIAGNOSIS — N72 Inflammatory disease of cervix uteri: Secondary | ICD-10-CM | POA: Diagnosis not present

## 2013-12-25 DIAGNOSIS — N920 Excessive and frequent menstruation with regular cycle: Principal | ICD-10-CM | POA: Insufficient documentation

## 2013-12-25 DIAGNOSIS — D259 Leiomyoma of uterus, unspecified: Secondary | ICD-10-CM | POA: Insufficient documentation

## 2013-12-25 DIAGNOSIS — N838 Other noninflammatory disorders of ovary, fallopian tube and broad ligament: Secondary | ICD-10-CM | POA: Insufficient documentation

## 2013-12-25 DIAGNOSIS — D649 Anemia, unspecified: Secondary | ICD-10-CM

## 2013-12-25 HISTORY — PX: CYSTOSCOPY: SHX5120

## 2013-12-25 HISTORY — DX: Activated protein C resistance: D68.51

## 2013-12-25 HISTORY — PX: LAPAROSCOPIC ASSISTED VAGINAL HYSTERECTOMY: SHX5398

## 2013-12-25 LAB — ABO/RH: ABO/RH(D): A POS

## 2013-12-25 LAB — PREPARE RBC (CROSSMATCH)

## 2013-12-25 LAB — PREGNANCY, URINE: PREG TEST UR: NEGATIVE

## 2013-12-25 SURGERY — HYSTERECTOMY, VAGINAL, LAPAROSCOPY-ASSISTED
Anesthesia: General | Site: Bladder

## 2013-12-25 MED ORDER — DEXTROSE-NACL 5-0.9 % IV SOLN
INTRAVENOUS | Status: DC
  Administered 2013-12-25: 22:00:00 via INTRAVENOUS

## 2013-12-25 MED ORDER — MIDAZOLAM HCL 2 MG/2ML IJ SOLN
INTRAMUSCULAR | Status: AC
  Filled 2013-12-25: qty 2

## 2013-12-25 MED ORDER — SODIUM CHLORIDE 0.9 % IJ SOLN: 9.0000 mL | INTRAMUSCULAR | Status: DC | PRN

## 2013-12-25 MED ORDER — HYDROMORPHONE HCL PF 1 MG/ML IJ SOLN
INTRAMUSCULAR | Status: AC
  Filled 2013-12-25: qty 1

## 2013-12-25 MED ORDER — METHYLENE BLUE 1 % INJ SOLN
INTRAMUSCULAR | Status: AC
  Filled 2013-12-25: qty 1

## 2013-12-25 MED ORDER — LIDOCAINE-EPINEPHRINE 1 %-1:100000 IJ SOLN
INTRAMUSCULAR | Status: AC
  Filled 2013-12-25: qty 1

## 2013-12-25 MED ORDER — PROPOFOL 10 MG/ML IV EMUL
INTRAVENOUS | Status: AC
  Filled 2013-12-25: qty 20

## 2013-12-25 MED ORDER — NALOXONE HCL 0.4 MG/ML IJ SOLN: 0.4000 mg | INTRAMUSCULAR | Status: DC | PRN

## 2013-12-25 MED ORDER — METHYLENE BLUE 1 % INJ SOLN
INTRAMUSCULAR | Status: AC
  Filled 2013-12-25: qty 10

## 2013-12-25 MED ORDER — ROCURONIUM BROMIDE 100 MG/10ML IV SOLN
INTRAVENOUS | Status: AC
  Filled 2013-12-25: qty 1

## 2013-12-25 MED ORDER — ONDANSETRON HCL 4 MG/2ML IJ SOLN: 4.0000 mg | Freq: Four times a day (QID) | INTRAMUSCULAR | Status: DC | PRN

## 2013-12-25 MED ORDER — ENOXAPARIN SODIUM 40 MG/0.4ML ~~LOC~~ SOLN
40.0000 mg | SUBCUTANEOUS | Status: AC
  Administered 2013-12-25: 40 mg via SUBCUTANEOUS
  Filled 2013-12-25: qty 0.4

## 2013-12-25 MED ORDER — NEOSTIGMINE METHYLSULFATE 10 MG/10ML IV SOLN
INTRAVENOUS | Status: AC
  Filled 2013-12-25: qty 1

## 2013-12-25 MED ORDER — PROPOFOL 10 MG/ML IV BOLUS
INTRAVENOUS | Status: DC | PRN
  Administered 2013-12-25: 200 mg via INTRAVENOUS

## 2013-12-25 MED ORDER — KETOROLAC TROMETHAMINE 30 MG/ML IJ SOLN
30.0000 mg | Freq: Four times a day (QID) | INTRAMUSCULAR | Status: DC
  Administered 2013-12-26 (×2): 30 mg via INTRAVENOUS
  Filled 2013-12-25 (×2): qty 1

## 2013-12-25 MED ORDER — ONDANSETRON HCL 4 MG/2ML IJ SOLN
INTRAMUSCULAR | Status: DC | PRN
  Administered 2013-12-25: 4 mg via INTRAVENOUS

## 2013-12-25 MED ORDER — ROCURONIUM BROMIDE 100 MG/10ML IV SOLN
INTRAVENOUS | Status: DC | PRN
  Administered 2013-12-25: 30 mg via INTRAVENOUS

## 2013-12-25 MED ORDER — NEOSTIGMINE METHYLSULFATE 10 MG/10ML IV SOLN
INTRAVENOUS | Status: DC | PRN
  Administered 2013-12-25: 2 mg via INTRAVENOUS

## 2013-12-25 MED ORDER — LACTATED RINGERS IV SOLN
INTRAVENOUS | Status: DC
  Administered 2013-12-25 (×2): via INTRAVENOUS

## 2013-12-25 MED ORDER — LIDOCAINE HCL (CARDIAC) 20 MG/ML IV SOLN
INTRAVENOUS | Status: AC
  Filled 2013-12-25: qty 5

## 2013-12-25 MED ORDER — KETOROLAC TROMETHAMINE 30 MG/ML IJ SOLN: 30.0000 mg | Freq: Four times a day (QID) | INTRAMUSCULAR | Status: DC

## 2013-12-25 MED ORDER — ENOXAPARIN SODIUM 40 MG/0.4ML ~~LOC~~ SOLN
40.0000 mg | SUBCUTANEOUS | Status: DC
  Administered 2013-12-26: 40 mg via SUBCUTANEOUS
  Filled 2013-12-25 (×2): qty 0.4

## 2013-12-25 MED ORDER — MORPHINE SULFATE (PF) 1 MG/ML IV SOLN
INTRAVENOUS | Status: DC
  Administered 2013-12-25: 3 mg via INTRAVENOUS
  Administered 2013-12-25: 17:00:00 via INTRAVENOUS
  Administered 2013-12-26: 1.5 mg via INTRAVENOUS
  Filled 2013-12-25: qty 25

## 2013-12-25 MED ORDER — LIDOCAINE-EPINEPHRINE 1 %-1:100000 IJ SOLN
INTRAMUSCULAR | Status: DC | PRN
  Administered 2013-12-25: 16 mL

## 2013-12-25 MED ORDER — KETOROLAC TROMETHAMINE 30 MG/ML IJ SOLN
INTRAMUSCULAR | Status: DC | PRN
  Administered 2013-12-25: 30 mg via INTRAVENOUS

## 2013-12-25 MED ORDER — GLYCOPYRROLATE 0.2 MG/ML IJ SOLN
INTRAMUSCULAR | Status: AC
  Filled 2013-12-25: qty 3

## 2013-12-25 MED ORDER — LEVOTHYROXINE SODIUM 125 MCG PO TABS
125.0000 ug | ORAL_TABLET | Freq: Every day | ORAL | Status: DC
  Administered 2013-12-26: 125 ug via ORAL
  Filled 2013-12-25: qty 1

## 2013-12-25 MED ORDER — LACTATED RINGERS IR SOLN
Status: DC | PRN
  Administered 2013-12-25: 3000 mL

## 2013-12-25 MED ORDER — DIPHENHYDRAMINE HCL 50 MG/ML IJ SOLN: 12.5000 mg | Freq: Four times a day (QID) | INTRAMUSCULAR | Status: DC | PRN

## 2013-12-25 MED ORDER — METHYLENE BLUE 1 % INJ SOLN
INTRAMUSCULAR | Status: DC | PRN
  Administered 2013-12-25: 10 mL via INTRAVENOUS

## 2013-12-25 MED ORDER — FENTANYL CITRATE 0.05 MG/ML IJ SOLN
INTRAMUSCULAR | Status: DC | PRN
  Administered 2013-12-25 (×5): 50 ug via INTRAVENOUS

## 2013-12-25 MED ORDER — HYDROMORPHONE HCL PF 1 MG/ML IJ SOLN
0.2500 mg | INTRAMUSCULAR | Status: DC | PRN
  Administered 2013-12-25 (×2): 0.5 mg via INTRAVENOUS

## 2013-12-25 MED ORDER — DEXAMETHASONE SODIUM PHOSPHATE 10 MG/ML IJ SOLN
INTRAMUSCULAR | Status: DC | PRN
  Administered 2013-12-25: 10 mg via INTRAVENOUS

## 2013-12-25 MED ORDER — FENTANYL CITRATE 0.05 MG/ML IJ SOLN
INTRAMUSCULAR | Status: AC
  Filled 2013-12-25: qty 5

## 2013-12-25 MED ORDER — GLYCOPYRROLATE 0.2 MG/ML IJ SOLN
INTRAMUSCULAR | Status: DC | PRN
  Administered 2013-12-25: 0.2 mg via INTRAVENOUS
  Administered 2013-12-25: .4 mg via INTRAVENOUS

## 2013-12-25 MED ORDER — BUPIVACAINE HCL (PF) 0.25 % IJ SOLN
INTRAMUSCULAR | Status: DC | PRN
  Administered 2013-12-25: 6 mL

## 2013-12-25 MED ORDER — KETOROLAC TROMETHAMINE 30 MG/ML IJ SOLN
INTRAMUSCULAR | Status: AC
  Filled 2013-12-25: qty 1

## 2013-12-25 MED ORDER — LIDOCAINE HCL (CARDIAC) 20 MG/ML IV SOLN
INTRAVENOUS | Status: DC | PRN
  Administered 2013-12-25: 80 mg via INTRAVENOUS

## 2013-12-25 MED ORDER — ONDANSETRON HCL 4 MG/2ML IJ SOLN
INTRAMUSCULAR | Status: AC
  Filled 2013-12-25: qty 2

## 2013-12-25 MED ORDER — DEXAMETHASONE SODIUM PHOSPHATE 10 MG/ML IJ SOLN
INTRAMUSCULAR | Status: AC
  Filled 2013-12-25: qty 1

## 2013-12-25 MED ORDER — MIDAZOLAM HCL 2 MG/2ML IJ SOLN
INTRAMUSCULAR | Status: DC | PRN
  Administered 2013-12-25: 2 mg via INTRAVENOUS

## 2013-12-25 MED ORDER — DIPHENHYDRAMINE HCL 12.5 MG/5ML PO ELIX: 12.5000 mg | ORAL_SOLUTION | Freq: Four times a day (QID) | ORAL | Status: DC | PRN

## 2013-12-25 MED ORDER — BUPIVACAINE HCL (PF) 0.25 % IJ SOLN
INTRAMUSCULAR | Status: AC
  Filled 2013-12-25: qty 30

## 2013-12-25 MED ORDER — STERILE WATER FOR IRRIGATION IR SOLN
Status: DC | PRN
  Administered 2013-12-25: 1000 mL

## 2013-12-25 MED ORDER — HYDROMORPHONE HCL PF 1 MG/ML IJ SOLN
INTRAMUSCULAR | Status: DC | PRN
  Administered 2013-12-25: 1 mg via INTRAVENOUS

## 2013-12-25 SURGICAL SUPPLY — 48 items
ADH SKN CLS APL DERMABOND .7 (GAUZE/BANDAGES/DRESSINGS) ×1
BLADE 10 SAFETY STRL DISP (BLADE) ×4 IMPLANT
CABLE HIGH FREQUENCY MONO STRZ (ELECTRODE) ×4 IMPLANT
CLOTH BEACON ORANGE TIMEOUT ST (SAFETY) ×4 IMPLANT
CONT PATH 16OZ SNAP LID 3702 (MISCELLANEOUS) ×4 IMPLANT
COVER MAYO STAND STRL (DRAPES) ×4 IMPLANT
COVER TABLE BACK 60X90 (DRAPES) ×4 IMPLANT
DECANTER SPIKE VIAL GLASS SM (MISCELLANEOUS) ×8 IMPLANT
DERMABOND ADVANCED (GAUZE/BANDAGES/DRESSINGS) ×2
DERMABOND ADVANCED .7 DNX12 (GAUZE/BANDAGES/DRESSINGS) ×2 IMPLANT
DRSG COVADERM PLUS 2X2 (GAUZE/BANDAGES/DRESSINGS) ×8 IMPLANT
DRSG OPSITE POSTOP 3X4 (GAUZE/BANDAGES/DRESSINGS) ×4 IMPLANT
DURAPREP 26ML APPLICATOR (WOUND CARE) ×4 IMPLANT
ELECT BALL LEEP 5MM RED (ELECTRODE) ×4 IMPLANT
ELECT BLADE 6.5 EXT (BLADE) ×4 IMPLANT
ELECT REM PT RETURN 9FT ADLT (ELECTROSURGICAL) ×4
ELECTRODE REM PT RTRN 9FT ADLT (ELECTROSURGICAL) ×2 IMPLANT
GLOVE BIO SURGEON STRL SZ7.5 (GLOVE) ×12 IMPLANT
GLOVE BIOGEL PI IND STRL 7.0 (GLOVE) ×18 IMPLANT
GLOVE BIOGEL PI INDICATOR 7.0 (GLOVE) ×18
GLOVE SURG SS PI 7.0 STRL IVOR (GLOVE) ×32 IMPLANT
GOWN STRL REUS W/ TWL LRG LVL3 (GOWN DISPOSABLE) ×18 IMPLANT
GOWN STRL REUS W/TWL LRG LVL3 (GOWN DISPOSABLE) ×18
LEGGING LITHOTOMY PAIR STRL (DRAPES) ×4 IMPLANT
NS IRRIG 1000ML POUR BTL (IV SOLUTION) ×4 IMPLANT
PACK LAVH (CUSTOM PROCEDURE TRAY) ×4 IMPLANT
PAD OB MATERNITY 4.3X12.25 (PERSONAL CARE ITEMS) ×4 IMPLANT
PROTECTOR NERVE ULNAR (MISCELLANEOUS) ×8 IMPLANT
SET CYSTO W/LG BORE CLAMP LF (SET/KITS/TRAYS/PACK) ×4 IMPLANT
SET IRRIG TUBING LAPAROSCOPIC (IRRIGATION / IRRIGATOR) ×4 IMPLANT
SHEARS HARMONIC ACE PLUS 36CM (ENDOMECHANICALS) ×4 IMPLANT
SOLUTION ELECTROLUBE (MISCELLANEOUS) ×4 IMPLANT
SUT PLAIN 4 0 FS 2 27 (SUTURE) ×4 IMPLANT
SUT VIC AB 0 CT1 18XCR BRD8 (SUTURE) ×6 IMPLANT
SUT VIC AB 0 CT1 27 (SUTURE) ×4
SUT VIC AB 0 CT1 27XBRD ANBCTR (SUTURE) ×2 IMPLANT
SUT VIC AB 0 CT1 8-18 (SUTURE) ×9
SUT VIC AB 2-0 SH 27 (SUTURE) ×3
SUT VIC AB 2-0 SH 27XBRD (SUTURE) ×2 IMPLANT
SUT VICRYL 0 TIES 12 18 (SUTURE) ×4 IMPLANT
SUT VICRYL 0 UR6 27IN ABS (SUTURE) ×4 IMPLANT
SYR BULB IRRIGATION 50ML (SYRINGE) ×4 IMPLANT
TOWEL OR 17X24 6PK STRL BLUE (TOWEL DISPOSABLE) ×12 IMPLANT
TRAY FOLEY CATH 14FR (SET/KITS/TRAYS/PACK) ×4 IMPLANT
TROCAR XCEL NON-BLD 11X100MML (ENDOMECHANICALS) ×4 IMPLANT
TROCAR XCEL NON-BLD 5MMX100MML (ENDOMECHANICALS) IMPLANT
TROCAR XCEL OPT SLVE 5M 100M (ENDOMECHANICALS) IMPLANT
WATER STERILE IRR 1000ML POUR (IV SOLUTION) ×4 IMPLANT

## 2013-12-25 NOTE — Progress Notes (Signed)
Sheila Strickland 12/04/1960 [id number]   Day of Surgery s/p Procedure(s): LAPAROSCOPIC ASSISTED VAGINAL HYSTERECTOMY WITH BILATERAL SALPINGO-OOPHORECTOMY CYSTOSCOPY  Subjective: Patient reports feels well, no acute distress, pain severity reported mild, Yes.   taking PO, foley catheter in place with bluish clear urine, No. ambulating, No. passing flatus  Objective: Vital signs in last 24 hours: Temp:  [97.8 F (36.6 C)-99 F (37.2 C)] 98 F (36.7 C) (07/21 1730) Pulse Rate:  [78-88] 88 (07/21 1730) Resp:  [12-18] 12 (07/21 1730) BP: (113-150)/(65-95) 131/74 mmHg (07/21 1730) SpO2:  [98 %-100 %] 98 % (07/21 1730) Weight:  [145 lb (65.772 kg)] 145 lb (65.772 kg) (07/21 1650) Last BM Date: 12/25/13  EXAM General: awake, alert and no distress Resp: clear to auscultation bilaterally Cardio: regular rate and rhythm, S1, S2 normal, no murmur, click, rub or gallop GI: soft, minimal tender, bowel sounds present, dressings dry Lower Extremities: Without swelling or tenderness Vaginal Bleeding: reported scant  Lab Results:  No results found for this basename: WBC, HGB, HCT, PLT,  in the last 72 hours  Assessment: s/p Procedure(s): LAPAROSCOPIC ASSISTED VAGINAL HYSTERECTOMY WITH BILATERAL SALPINGO-OOPHORECTOMY CYSTOSCOPY:  Stable.  Results of surgery discussed with patient and family members.  Blood loss reviewed and potential for transfusion reviewed and accepted.    Plan: Continue routine post operative care. Check CBC in AM Tentative D/C in AM if continues well.  LOS: 0 days   Note: This document was prepared with digital dictation and possible smart phrase technology. Any transcriptional errors that result from this process are unintentional.  Anastasio Auerbach MD, 5:51 PM 12/25/2013

## 2013-12-25 NOTE — Anesthesia Preprocedure Evaluation (Signed)

## 2013-12-25 NOTE — Anesthesia Postprocedure Evaluation (Signed)
  Anesthesia Post Note  Patient: Sheila Strickland  Procedure(s) Performed: Procedure(s) (LRB): LAPAROSCOPIC ASSISTED VAGINAL HYSTERECTOMY WITH BILATERAL SALPINGO-OOPHORECTOMY (Bilateral) CYSTOSCOPY (N/A)  Anesthesia type: GA  Patient location: PACU  Post pain: Pain level controlled  Post assessment: Post-op Vital signs reviewed  Last Vitals:  Filed Vitals:   12/25/13 1123  BP: 150/95  Pulse: 78  Temp: 37.2 C  Resp: 18    Post vital signs: Reviewed  Level of consciousness: sedated  Complications: No apparent anesthesia complications

## 2013-12-25 NOTE — Consult Note (Signed)
Sheila Strickland and [employer] from blood bank arrived to OR room 7 at 1447.  They parted from OR room 7 at 1455.

## 2013-12-25 NOTE — H&P (Signed)
  The patient was examined.  I reviewed the proposed surgery and consent form with the patient.  We agree with removal of both ovaries, tubes and uterus.  Leiden factor V issue again reviewed.  The dictated history and physical is current and accurate and all questions were answered. The patient is ready to proceed with surgery and has a realistic understanding and expectation for the outcome.   Anastasio Auerbach MD, 12:44 PM 12/25/2013

## 2013-12-25 NOTE — Op Note (Signed)
Sheila Strickland [birth year] [id number]   Post Operative Note   Date of surgery:  12/25/2013  Pre Op Dx:  Menorrhagia, enlarging leiomyoma, pelvic pain, recurrent endometrial polyp  Post Op Dx:  Menorrhagia, enlarging leiomyoma, pelvic pain, recurrent endometrial polyp  Procedure:  Laparoscopic-assisted vaginal hysterectomy, bilateral salpingo-oophorectomy, cystoscopy  Surgeon:  Anastasio Auerbach  Assistant:  Uvaldo Rising  Anesthesia:  General  EBL:  1000 cc anesthesia reported  Complications:  None  Specimen:  Morcellated uterus with right and left ovaries and right and left fallopian tubes to pathology. Clinical weight 476 g  Findings: EUA:  External BUS vagina normal. Cervix grossly normal. Uterus bulky filling the pelvis. Adnexa without gross masses   Operative:  Large uterus with multiple myomas. Right and left fallopian tubes normal length caliber and fimbriated ends. Right and left ovaries grossly normal free and mobile. No evidence of pelvic adhesions or endometriosis. Upper abdominal exam showed liver smooth without abnormalities. Appendix grossly normal. Gallbladder not visualized.  Procedure:  The patient was taken to the operating room, having received prophylactic Lovenox 40 mg 2 hours previously, underwent general anesthesia, was placed in the low dorsal lithotomy position, received a vaginal/perineal preparation Betadine solution and a subsequent abdominal preparation with DuraPrep per nursing personnel. The time out was performed by the surgical team. An EUA was performed and a Hulka tenaculum was placed through the cervix and a Foley catheter placed in sterile technique. The patient was draped in the usual fashion and a transverse infraumbilical incision was made and using the 10 mm Optiview type direct entry trocar the abdomen was directly entered under direct visualization without difficulty and subsequently insufflated. Right and left suprapubic 5 mm ports were then  placed under direct visualization after transillumination to the vessels without difficulty. Examination of the pelvic organs and upper abdominal exam was carried out with findings noted above. Both right and left ureters were identified and traced along the pelvic sidewalls. The right infundibulopelvic ligament and vessels were identified and transected using the harmonic scalpel without difficulty. the right broad ligament was transected as well as the right round ligament again using the Harmonic Scalpel and the anterior vesicouterine peritoneal fold was transected to the midline. A similar procedure was carried out on the other side meeting the peritoneal vesicouterine incision in the midline.  Attention was then turned to the vaginal portion of the procedure and the patient was placed in the high dorsal lithotomy position, the Hulka tenaculum removed, the cervix visualized with a weighted speculum, anterior lip grasped with a single-tooth tenaculum and the cervical mucosa was circumferentially injected using 20 cc of 1% lidocaine with 1/100,000 dilution of epinephrine. The cervical mucosa was then circumferentially incised with a scalpel. It was noted at this point that the patient oozed considerably from the incision lines and was felt probably due to hypervascularization from her leiomyoma although also possible contribution from her Lovenox. The paracervical planes were developed sharply and the posterior cul-de-sac was ultimately entered without difficulty and a long weighted speculum was placed. The right and left uterosacral ligaments were clamped cut and ligated using 0 Vicryl suture and tagged for future reference. The anterior vesicouterine plane was progressively sharply and bluntly developed and the anterior cul-de-sac was ultimately entered without difficulty. The parametrial tissues bilaterally were clamped cut and ligated using 0 Vicryl suture again noting generous bleeding along the way from  aberrant vessels that had to be individually ligated. After identifying the uterine vessels bilaterally, which were clamped cut and  ligated using 0 Vicryl suture, it was apparent to complete the vaginal portion of the procedure morsilization was necessary to remove the uterus. The cervix was then cored from the specimen and through progressive sharp excision the uterine myometrium and multiple leiomyoma were excised and removed which allowed for progressive delivery of the uterus which ultimately delivered vaginally. The remaining attachments were clamped cut and ligated using 0 Vicryl suture allowing for removal of the complete specimen which was sent to pathology. The pelvis was irrigated with adequate hemostasis noted. During the morselization process anesthesia notified me that they estimated the blood loss was approximately 1000 cc and at that point type and cross 2 units were ordered to have available, if needed. The patient was noted to remain clinically stable throughout with good blood pressures and normal pulse rates as well as good urine output. The long weighted speculum was replaced with a shorter weighted speculum and the posterior vaginal cuff was run from uterosacral ligament to uterosacral ligament with a running interlocking 0 Vicryl suture. The vagina was then closed anterior to posterior using 0 Vicryl suture in interrupted figure-of-eight stitch, the vagina was irrigated noting adequate hemostasis. The speculum was removed, the Foley catheter removed, the patient having previously received intravenous methylene blue and cystoscopy was performed showing good bladder distention with no evidence of bladder injury and bilateral ureteral jets. The Foley catheter was replaced and the entire surgical team regloved and regowned and attention was then returned to the abdominal portion where the abdomen was reinsufflated, the pelvis copiously irrigated and adequate hemostasis was visualized at all  surgical sites. The gas was slowly allowed to escape and hemostasis was again visualized and the 5 mm suprapubic ports were then removed showing adequate hemostasis and the infraumbilical port was backed out again showing adequate hemostasis no evidence of hernia formation. All skin incisions were injected using 0.25% Marcaine. A 0 Vicryl interrupted subcutaneous/fascial stitch was placed infraumbilically and all skin incisions closed using Dermabond skin adhesive. The patient received intraoperative Toradol and was taken to the recovery room in good condition having tolerated the procedure well.   Note: This document was prepared with digital dictation and possible smart phrase technology. Any transcriptional errors that result from this process are unintentional.  Anastasio Auerbach MD, 5:03 PM 12/25/2013

## 2013-12-25 NOTE — Transfer of Care (Signed)
Immediate Anesthesia Transfer of Care Note  Patient: Sheila Strickland  Procedure(s) Performed: Procedure(s): LAPAROSCOPIC ASSISTED VAGINAL HYSTERECTOMY WITH BILATERAL SALPINGO-OOPHORECTOMY (Bilateral) CYSTOSCOPY (N/A)  Patient Location: PACU  Anesthesia Type:General  Level of Consciousness: awake, alert  and oriented  Airway & Oxygen Therapy: Patient Spontanous Breathing and Patient connected to nasal cannula oxygen  Post-op Assessment: Report given to PACU RN, Post -op Vital signs reviewed and stable and Patient moving all extremities  Post vital signs: Reviewed and stable  Complications: No apparent anesthesia complications

## 2013-12-26 ENCOUNTER — Encounter (HOSPITAL_COMMUNITY): Payer: Self-pay | Admitting: Gynecology

## 2013-12-26 ENCOUNTER — Other Ambulatory Visit: Payer: Self-pay | Admitting: Gynecology

## 2013-12-26 DIAGNOSIS — N920 Excessive and frequent menstruation with regular cycle: Secondary | ICD-10-CM | POA: Diagnosis not present

## 2013-12-26 LAB — CBC
HCT: 29.1 % — ABNORMAL LOW (ref 36.0–46.0)
Hemoglobin: 9.7 g/dL — ABNORMAL LOW (ref 12.0–15.0)
MCH: 29.7 pg (ref 26.0–34.0)
MCHC: 33.3 g/dL (ref 30.0–36.0)
MCV: 89 fL (ref 78.0–100.0)
Platelets: 148 10*3/uL — ABNORMAL LOW (ref 150–400)
RBC: 3.27 MIL/uL — ABNORMAL LOW (ref 3.87–5.11)
RDW: 12.7 % (ref 11.5–15.5)
WBC: 9.8 10*3/uL (ref 4.0–10.5)

## 2013-12-26 MED ORDER — ESTRADIOL 0.05 MG/24HR TD PTTW: 1.0000 | MEDICATED_PATCH | TRANSDERMAL | Status: DC

## 2013-12-26 MED ORDER — HYDROCODONE-ACETAMINOPHEN 5-325 MG PO TABS: 1.0000 | ORAL_TABLET | Freq: Four times a day (QID) | ORAL | Status: DC | PRN

## 2013-12-26 MED ORDER — ENOXAPARIN (LOVENOX) PATIENT EDUCATION KIT
PACK | Freq: Once | Status: DC
  Administered 2013-12-26: 10:00:00
  Filled 2013-12-26: qty 1

## 2013-12-26 NOTE — Progress Notes (Signed)
Pt watching educational video on "Patient guide to using Lovenox."

## 2013-12-26 NOTE — Anesthesia Postprocedure Evaluation (Signed)
  Anesthesia Post-op Note  Patient: Sheila Strickland  Procedure(s) Performed: Procedure(s): LAPAROSCOPIC ASSISTED VAGINAL HYSTERECTOMY WITH BILATERAL SALPINGO-OOPHORECTOMY (Bilateral) CYSTOSCOPY (N/A)  Patient Location: Mother/Baby  Anesthesia Type:General  Level of Consciousness: awake, alert , oriented and patient cooperative  Airway and Oxygen Therapy: Patient Spontanous Breathing  Post-op Pain: mild  Post-op Assessment: Patient's Cardiovascular Status Stable, Respiratory Function Stable, No signs of Nausea or vomiting and Pain level controlled  Post-op Vital Signs: stable  Last Vitals:  Filed Vitals:   12/26/13 0509  BP: 129/71  Pulse: 76  Temp: 36.8 C  Resp: 14    Complications: No apparent anesthesia complications

## 2013-12-26 NOTE — Discharge Instructions (Signed)
°  Postoperative Instructions Hysterectomy ° °Dr. Clint Strupp and the nursing staff have discussed postoperative instructions with you.  If you have any questions please ask them before you leave the hospital, or call Dr Norman Bier’s office at [phone].   ° °We would like to emphasize the following instructions: ° ° °  Call the office to make your follow-up appointment as recommended by Dr Mayanna Garlitz (usually 2 weeks). ° °  You were given a prescription, or one was ordered for you at the pharmacy you designated.  Get that prescription filled and take the medication according to instructions. ° °  You may eat a regular diet, but slowly until you start having bowel movements. ° °  Drink plenty of water daily. ° °  Nothing in the vagina (intercourse, douching, objects of any kind) until released by Dr Conal Shetley. ° °  No driving for two weeks.  Wait to be cleared by Dr Zadok Holaway at your first post op check.  Car rides (short) are ok after several days at home, as long as you are not having significant pain, but no traveling out of town. ° °  You may shower, but no baths.  Walking up and down stairs is ok.  No heavy lifting, prolonged standing, repeated bending or any “working out” until your first post op check. ° °  Rest frequently, listen to your body and do not push yourself and overdo it. ° °  Call if: ° °o Your pain medication does not seem strong enough. °o Worsening pain or abdominal bloating °o Persistent nausea or vomiting °o Difficulty with urination or bowel movements. °o Temperature of 101 degrees or higher. °o Bleeding heavier then staining (clots or period type flow). °o Incisions become red, tender or begin to drain. °o You have any questions or concerns. °

## 2013-12-26 NOTE — Progress Notes (Signed)
Pt discharged home with husband... Discharge and Lovenox instructions reviewed with pt, and she verbalized understanding... Condition stable... No equipment... Taken to car via wheelchair by C. Ovid Curd, [place].

## 2013-12-26 NOTE — Progress Notes (Signed)
Sheila Strickland Oct 14, 1960 [id number]   1 Day Post-Op Procedure(s) (LRB): LAPAROSCOPIC ASSISTED VAGINAL HYSTERECTOMY WITH BILATERAL SALPINGO-OOPHORECTOMY (Bilateral) CYSTOSCOPY (N/A)  Subjective: Patient reports feels well, no acute distress, pain severity reported mild, Yes.   taking PO, foley catheter out, Yes.   voiding, Yes.   ambulating, Yes.   passing flatus  Objective: Vital signs in last 24 hours: Temp:  [97.5 F (36.4 C)-99 F (37.2 C)] 98.3 F (36.8 C) (07/22 0509) Pulse Rate:  [72-88] 76 (07/22 0509) Resp:  [12-19] 14 (07/22 0509) BP: (113-150)/(65-95) 129/71 mmHg (07/22 0509) SpO2:  [98 %-100 %] 99 % (07/22 0509) Weight:  [145 lb (65.772 kg)] 145 lb (65.772 kg) (07/21 1650) Last BM Date: 12/24/13    EXAM General: awake, alert and no distress Resp: clear to auscultation bilaterally Cardio: regular rate and rhythm, S1, S2 normal, no murmur, click, rub or gallop GI: soft, non tender, bowel sounds active, incisions dry intact Lower Extremities: Without swelling or tenderness Vaginal Bleeding: Reported scant   Lab Results:   Recent Labs  12/26/13 0513  WBC 9.8  HGB 9.7*  HCT 29.1*  PLT 148*    Assessment: s/p Procedure(s): LAPAROSCOPIC ASSISTED VAGINAL HYSTERECTOMY WITH BILATERAL SALPINGO-OOPHORECTOMY CYSTOSCOPY: progressing well, eating, ambulating, voiding, minimal pain, ready for discharge.    Plan: Discharge home today.  Precautions, instructions and follow up were discussed with the patient.  Prescriptions provided per AVS.  Patient to call the office to arrange a post-operative appointmant in 2 weeks.   Note: This document was prepared with digital dictation and possible smart phrase technology. Any transcriptional errors that result from this process are unintentional.  Anastasio Auerbach MD, 7:41 AM 12/26/2013

## 2013-12-26 NOTE — Addendum Note (Signed)
Addendum created 12/26/13 7741 by Georgeanne Nim, CRNA   Modules edited: Notes Section   Notes Section:  File: [id number]

## 2013-12-28 LAB — TYPE AND SCREEN
ABO/RH(D): A POS
Antibody Screen: NEGATIVE
UNIT DIVISION: 0
Unit division: 0

## 2013-12-28 NOTE — Discharge Summary (Signed)
Sheila Strickland 1961/01/28 [id number]   Discharge Summary  Date of Admission:  12/25/2013  Date of Discharge:  12/26/2013  Discharge Diagnosis:  Leiomyoma, menorrhagia, pelvic pain, endometrial polyp  Procedure:  Procedure(s): LAPAROSCOPIC ASSISTED VAGINAL HYSTERECTOMY WITH BILATERAL SALPINGO-OOPHORECTOMY CYSTOSCOPY  Pathology: Uterus, ovaries and fallopian tubes, and endometrial polyps, clinical weight 476 g - CERVIX: UNREMARKABLE. - ENDOMETRIUM: BENIGN POLYPOID SECRETORY TYPE ENDOMETRIUM. - MYOMETRIUM: LEIOMYOMATA. - SEROSA: UNREMARKABLE. - BILATERAL ADNEXA: BENIGN FALLOPIAN TUBES AND OVARIES.   Hospital Course:  The patient underwent an uncomplicated LAVH BSO 49/20/1007. Her postoperative course was uncomplicated and she was discharged on the first postoperative day ambulating well, tolerating a regular diet, voiding without difficulty, with good pain relief on oral medication. Postoperative hemoglobin 9.7. The patient received instructions for postoperative care and call precautions.  She received prescriptions per AVS and will be seen in the office 2 weeks following discharge.     Note: This document was prepared with digital dictation and possible smart phrase technology. Any transcriptional errors that result from this process are unintentional.   Anastasio Auerbach MD, 2:46 PM 12/28/2013

## 2013-12-29 ENCOUNTER — Telehealth: Payer: Self-pay | Admitting: Gynecology

## 2013-12-29 NOTE — Telephone Encounter (Signed)
On Call Note 8:56PM: Went to bathroom and passed dark clot with bright red bleeding afterwards in a "gush".  Now with scant bleeding.  No pain.  Otherwise doing well with active day today to include going out for dinner.  Options for ER evaluation now vs observation with call back if any further bleeding offered and patient feels comfortable with observation now and will call if any further bleeding.

## 2014-01-07 ENCOUNTER — Encounter: Payer: Self-pay | Admitting: Gynecology

## 2014-01-07 ENCOUNTER — Ambulatory Visit (INDEPENDENT_AMBULATORY_CARE_PROVIDER_SITE_OTHER): Payer: 59 | Admitting: Gynecology

## 2014-01-07 DIAGNOSIS — Z9889 Other specified postprocedural states: Secondary | ICD-10-CM

## 2014-01-07 NOTE — Patient Instructions (Signed)
Slowly resume activities with the exception of continued nothing in the vagina. Followup in 2 weeks for your next postoperative visit.

## 2014-01-07 NOTE — Progress Notes (Signed)
MILAYA HORA 30-Jul-1960 [id number]        53 y.o.  [id number] presents for her first postoperative visit he is just finishing her Lovenox prophylaxis. Doing well on estradiol 0.05 mg patches without significant hot flushes or night sweats.  Past medical history,surgical history, problem list, medications, allergies, family history and social history were all reviewed and documented in the EPIC chart.  Directed ROS with pertinent positives and negatives documented in the history of present illness/assessment and plan.  Exam: Kim assistant General appearance:  Normal Abdomen with incisions healing nicely. Soft nontender without masses guarding rebound Pelvic external BUS vagina with cuff intact. Bimanual without masses or tenderness  Assessment/Plan:  53 y.o. [id number] normal postop check status post LAVH BSO. I reviewed the pathology report with her which showed benign leiomyoma. I again reviewed with her her heterologous Leiden factor V mutation and possible increased thrombosis risk with ERT. Risks benefits reviewed and she is comfortable with continuing her estrogen patch for now. She has no history of thrombosis in the past to include BCP use and pregnancy. Patient will followup in 2 weeks for her next postoperative visit. Continue pelvic rest with slowly increasing generalized activity.   Note: This document was prepared with digital dictation and possible smart phrase technology. Any transcriptional errors that result from this process are unintentional.   Anastasio Auerbach MD, 3:10 PM 01/07/2014

## 2014-01-08 ENCOUNTER — Ambulatory Visit: Payer: 59 | Admitting: Gynecology

## 2014-01-25 ENCOUNTER — Encounter: Payer: Self-pay | Admitting: Gynecology

## 2014-01-25 ENCOUNTER — Ambulatory Visit (INDEPENDENT_AMBULATORY_CARE_PROVIDER_SITE_OTHER): Payer: 59 | Admitting: Gynecology

## 2014-01-25 DIAGNOSIS — Z9889 Other specified postprocedural states: Secondary | ICD-10-CM

## 2014-01-25 NOTE — Patient Instructions (Signed)
Followup in January 2016 for your annual exam, sooner if any issues. Slowly resume normal activities. Abstain from intercourse for another 2-3 weeks.

## 2014-01-25 NOTE — Progress Notes (Signed)
Sheila Strickland [birth year] [id number]        53 y.o.  [id number] presents for her second postoperative visit status post LAVH BSO. She is doing great without complaints. On Vivelle 0.05 mg patch without significant symptoms.  Past medical history,surgical history, problem list, medications, allergies, family history and social history were all reviewed and documented in the EPIC chart.  Directed ROS with pertinent positives and negatives documented in the history of present illness/assessment and plan.  Exam: Kim assistant General appearance:  Normal Abdomen soft nontender without masses guarding rebound. Incisions healed nicely. Pelvic external BUS vagina with cuff intact. Bimanual without masses or tenderness  Assessment/Plan:  53 y.o. [id number] with normal postoperative visit. Options to represent in 2 weeks versus as needed discussed. She is doing well she does not feel that she needs to come back in 2 weeks. I did ask her to abstain from intercourse for another 2-3 weeks to allow complete healing of the vaginal cuff. She also slowly resume normal activities in the interim. Assuming she does well she'll return January 2016 when she is due for her annual exam.   Note: This document was prepared with digital dictation and possible smart Company secretary. Any transcriptional errors that result from this process are unintentional.   Anastasio Auerbach MD, 9:46 AM 01/25/2014

## 2014-03-11 ENCOUNTER — Other Ambulatory Visit: Payer: Self-pay

## 2014-03-11 MED ORDER — ESTRADIOL 0.05 MG/24HR TD PTTW: 1.0000 | MEDICATED_PATCH | TRANSDERMAL | Status: DC

## 2014-03-13 ENCOUNTER — Other Ambulatory Visit: Payer: Self-pay

## 2014-04-08 ENCOUNTER — Encounter: Payer: Self-pay | Admitting: Gynecology

## 2014-05-08 ENCOUNTER — Other Ambulatory Visit: Payer: Self-pay | Admitting: Gynecology

## 2014-05-28 ENCOUNTER — Other Ambulatory Visit: Payer: Self-pay | Admitting: Gynecology

## 2014-06-05 ENCOUNTER — Encounter: Payer: Self-pay | Admitting: Gynecology

## 2014-06-19 ENCOUNTER — Ambulatory Visit (INDEPENDENT_AMBULATORY_CARE_PROVIDER_SITE_OTHER): Payer: 59 | Admitting: Gynecology

## 2014-06-19 ENCOUNTER — Encounter: Payer: Self-pay | Admitting: Gynecology

## 2014-06-19 VITALS — BP 120/76 | Ht 67.0 in | Wt 146.0 lb

## 2014-06-19 DIAGNOSIS — Z79890 Hormone replacement therapy (postmenopausal): Secondary | ICD-10-CM

## 2014-06-19 DIAGNOSIS — Z01419 Encounter for gynecological examination (general) (routine) without abnormal findings: Secondary | ICD-10-CM

## 2014-06-19 MED ORDER — LEVOTHYROXINE SODIUM 125 MCG PO TABS: 125.0000 ug | ORAL_TABLET | Freq: Every day | ORAL | Status: DC

## 2014-06-19 MED ORDER — ESTRADIOL 0.05 MG/24HR TD PTTW: MEDICATED_PATCH | TRANSDERMAL | Status: DC

## 2014-06-19 MED ORDER — IBUPROFEN 800 MG PO TABS: 800.0000 mg | ORAL_TABLET | Freq: Three times a day (TID) | ORAL | Status: DC | PRN

## 2014-06-19 NOTE — Patient Instructions (Signed)
You may obtain a copy of any labs that were done today by logging onto MyChart as outlined in the instructions provided with your AVS (after visit summary). The office will not call with normal lab results but certainly if there are any significant abnormalities then we will contact you.   Health Maintenance, Female A healthy lifestyle and preventative care can promote health and wellness.  Maintain regular health, dental, and eye exams.  Eat a healthy diet. Foods like vegetables, fruits, whole grains, low-fat dairy products, and lean protein foods contain the nutrients you need without too many calories. Decrease your intake of foods high in solid fats, added sugars, and salt. Get information about a proper diet from your caregiver, if necessary.  Regular physical exercise is one of the most important things you can do for your health. Most adults should get at least 150 minutes of moderate-intensity exercise (any activity that increases your heart rate and causes you to sweat) each week. In addition, most adults need muscle-strengthening exercises on 2 or more days a week.   Maintain a healthy weight. The body mass index (BMI) is a screening tool to identify possible weight problems. It provides an estimate of body fat based on height and weight. Your caregiver can help determine your BMI, and can help you achieve or maintain a healthy weight. For adults 20 years and older:  A BMI below 18.5 is considered underweight.  A BMI of 18.5 to 24.9 is normal.  A BMI of 25 to 29.9 is considered overweight.  A BMI of 30 and above is considered obese.  Maintain normal blood lipids and cholesterol by exercising and minimizing your intake of saturated fat. Eat a balanced diet with plenty of fruits and vegetables. Blood tests for lipids and cholesterol should begin at age 61 and be repeated every 5 years. If your lipid or cholesterol levels are high, you are over 50, or you are a high risk for heart  disease, you may need your cholesterol levels checked more frequently.Ongoing high lipid and cholesterol levels should be treated with medicines if diet and exercise are not effective.  If you smoke, find out from your caregiver how to quit. If you do not use tobacco, do not start.  Lung cancer screening is recommended for adults aged 33 80 years who are at high risk for developing lung cancer because of a history of smoking. Yearly low-dose computed tomography (CT) is recommended for people who have at least a 30-pack-year history of smoking and are a current smoker or have quit within the past 15 years. A pack year of smoking is smoking an average of 1 pack of cigarettes a day for 1 year (for example: 1 pack a day for 30 years or 2 packs a day for 15 years). Yearly screening should continue until the smoker has stopped smoking for at least 15 years. Yearly screening should also be stopped for people who develop a health problem that would prevent them from having lung cancer treatment.  If you are pregnant, do not drink alcohol. If you are breastfeeding, be very cautious about drinking alcohol. If you are not pregnant and choose to drink alcohol, do not exceed 1 drink per day. One drink is considered to be 12 ounces (355 mL) of beer, 5 ounces (148 mL) of wine, or 1.5 ounces (44 mL) of liquor.  Avoid use of street drugs. Do not share needles with anyone. Ask for help if you need support or instructions about stopping  the use of drugs.  High blood pressure causes heart disease and increases the risk of stroke. Blood pressure should be checked at least every 1 to 2 years. Ongoing high blood pressure should be treated with medicines, if weight loss and exercise are not effective.  If you are 59 to 54 years old, ask your caregiver if you should take aspirin to prevent strokes.  Diabetes screening involves taking a blood sample to check your fasting blood sugar level. This should be done once every 3  years, after age 91, if you are within normal weight and without risk factors for diabetes. Testing should be considered at a younger age or be carried out more frequently if you are overweight and have at least 1 risk factor for diabetes.  Breast cancer screening is essential preventative care for women. You should practice "breast self-awareness." This means understanding the normal appearance and feel of your breasts and may include breast self-examination. Any changes detected, no matter how small, should be reported to a caregiver. Women in their 66s and 30s should have a clinical breast exam (CBE) by a caregiver as part of a regular health exam every 1 to 3 years. After age 101, women should have a CBE every year. Starting at age 100, women should consider having a mammogram (breast X-ray) every year. Women who have a family history of breast cancer should talk to their caregiver about genetic screening. Women at a high risk of breast cancer should talk to their caregiver about having an MRI and a mammogram every year.  Breast cancer gene (BRCA)-related cancer risk assessment is recommended for women who have family members with BRCA-related cancers. BRCA-related cancers include breast, ovarian, tubal, and peritoneal cancers. Having family members with these cancers may be associated with an increased risk for harmful changes (mutations) in the breast cancer genes BRCA1 and BRCA2. Results of the assessment will determine the need for genetic counseling and BRCA1 and BRCA2 testing.  The Pap test is a screening test for cervical cancer. Women should have a Pap test starting at age 57. Between ages 25 and 35, Pap tests should be repeated every 2 years. Beginning at age 37, you should have a Pap test every 3 years as long as the past 3 Pap tests have been normal. If you had a hysterectomy for a problem that was not cancer or a condition that could lead to cancer, then you no longer need Pap tests. If you are  between ages 50 and 76, and you have had normal Pap tests going back 10 years, you no longer need Pap tests. If you have had past treatment for cervical cancer or a condition that could lead to cancer, you need Pap tests and screening for cancer for at least 20 years after your treatment. If Pap tests have been discontinued, risk factors (such as a new sexual partner) need to be reassessed to determine if screening should be resumed. Some women have medical problems that increase the chance of getting cervical cancer. In these cases, your caregiver may recommend more frequent screening and Pap tests.  The human papillomavirus (HPV) test is an additional test that may be used for cervical cancer screening. The HPV test looks for the virus that can cause the cell changes on the cervix. The cells collected during the Pap test can be tested for HPV. The HPV test could be used to screen women aged 44 years and older, and should be used in women of any age  who have unclear Pap test results. After the age of 55, women should have HPV testing at the same frequency as a Pap test.  Colorectal cancer can be detected and often prevented. Most routine colorectal cancer screening begins at the age of 44 and continues through age 20. However, your caregiver may recommend screening at an earlier age if you have risk factors for colon cancer. On a yearly basis, your caregiver may provide home test kits to check for hidden blood in the stool. Use of a small camera at the end of a tube, to directly examine the colon (sigmoidoscopy or colonoscopy), can detect the earliest forms of colorectal cancer. Talk to your caregiver about this at age 86, when routine screening begins. Direct examination of the colon should be repeated every 5 to 10 years through age 13, unless early forms of pre-cancerous polyps or small growths are found.  Hepatitis C blood testing is recommended for all people born from [birth year] through 1965 and any  individual with known risks for hepatitis C.  Practice safe sex. Use condoms and avoid high-risk sexual practices to reduce the spread of sexually transmitted infections (STIs). Sexually active women aged 36 and younger should be checked for Chlamydia, which is a common sexually transmitted infection. Older women with new or multiple partners should also be tested for Chlamydia. Testing for other STIs is recommended if you are sexually active and at increased risk.  Osteoporosis is a disease in which the bones lose minerals and strength with aging. This can result in serious bone fractures. The risk of osteoporosis can be identified using a bone density scan. Women ages 20 and over and women at risk for fractures or osteoporosis should discuss screening with their caregivers. Ask your caregiver whether you should be taking a calcium supplement or vitamin D to reduce the rate of osteoporosis.  Menopause can be associated with physical symptoms and risks. Hormone replacement therapy is available to decrease symptoms and risks. You should talk to your caregiver about whether hormone replacement therapy is right for you.  Use sunscreen. Apply sunscreen liberally and repeatedly throughout the day. You should seek shade when your shadow is shorter than you. Protect yourself by wearing long sleeves, pants, a wide-brimmed hat, and sunglasses year round, whenever you are outdoors.  Notify your caregiver of new moles or changes in moles, especially if there is a change in shape or color. Also notify your caregiver if a mole is larger than the size of a pencil eraser.  Stay current with your immunizations. Document Released: 12/07/2010 Document Revised: 09/18/2012 Document Reviewed: 12/07/2010 [facility] Patient Information 2014 [employer].

## 2014-06-19 NOTE — Progress Notes (Signed)
Sheila Strickland Oct 11, 1960 [id number]        54 y.o.  G2P2002 for annual exam.  Several issues noted below.  Past medical history,surgical history, problem list, medications, allergies, family history and social history were all reviewed and documented as reviewed in the EPIC chart.  ROS:  Performed with pertinent positives and negatives included in the history, assessment and plan.   Additional significant findings :   None   Exam: Kim Counsellor Vitals:   06/19/14 1007  BP: 120/76  Height: 5\' 7"  (1.702 m)  Weight: 146 lb (66.225 kg)   General appearance:  Normal affect, orientation and appearance. Skin: Grossly normal HEENT: Without gross lesions.  No cervical or supraclavicular adenopathy. Thyroid normal.  Lungs:  Clear without wheezing, rales or rhonchi Cardiac: RR, without RMG Abdominal:  Soft, nontender, without masses, guarding, rebound, organomegaly or hernia Breasts:  Examined lying and sitting without masses, retractions, discharge or axillary adenopathy. Pelvic:  Ext/BUS/vagina normal  Adnexa  Without masses or tenderness    Anus and perineum  Normal   Rectovaginal  Normal sphincter tone without palpated masses or tenderness.    Assessment/Plan:  54 y.o. [id number] female for annual exam.   1. Status post LAVH BSO 12/2013 for leiomyoma, menorrhagia, endometrial polyp. On ERT Vivelle 0.05 mg patch. Doing well without symptoms. History of Leiden factor V heterologous. I again reviewed the whole issue of use of ERT in a patient with a higher risk of thrombosis. She has no personal history of thrombosis. Benefits of transdermal absorption also reviewed. Issues to wean versus continuing discussed. Patient strongly wants to continue. She clearly understands the issues of thrombosis to include stroke heart attack DVT. I reviewed the WHI study with her again to include potential risk for breast cancer. She understands all of this and wants to continue and I refilled her 1  year. 2. Pap smear 05/2012.  No Pap smear done today. History of LEEP 2008 for CIN-1. Negative Pap smears since. Options to stop screening altogether or less frequent screening intervals reviewed. Will readdress on an annual basis. 3. Mammography 05/2014. Continue with annual mammography. SBE monthly reviewed. 4. Colonoscopy 2013. Repeat at their recommended interval. 5. DEXA never. Plan further into the menopause.  Check vitamin D level. Increased calcium vitamin D reviewed. 6. Hypothyroid.  Check TSH today. Refill Synthroid 125 g 1 year. 7. Occasional migraine. Uses ibuprofen 800 mg. #60 with 3 refills provided. 8. Health maintenance. Baseline CBC, comprehensive metabolic panel, lipid profile, urinalysis, TSH, vitamin D ordered. Follow up in one year, sooner as needed.     Anastasio Auerbach MD, 10:35 AM 06/19/2014

## 2014-06-20 LAB — URINALYSIS W MICROSCOPIC + REFLEX CULTURE
BACTERIA UA: NONE SEEN
Bilirubin Urine: NEGATIVE
CRYSTALS: NONE SEEN
Casts: NONE SEEN
GLUCOSE, UA: NEGATIVE mg/dL
KETONES UR: NEGATIVE mg/dL
Leukocytes, UA: NEGATIVE
NITRITE: NEGATIVE
Protein, ur: NEGATIVE mg/dL
SQUAMOUS EPITHELIAL / LPF: NONE SEEN
Specific Gravity, Urine: 1.005 (ref 1.005–1.030)
Urobilinogen, UA: 0.2 mg/dL (ref 0.0–1.0)
pH: 7 (ref 5.0–8.0)

## 2014-07-03 ENCOUNTER — Other Ambulatory Visit: Payer: 59

## 2014-07-03 LAB — COMPREHENSIVE METABOLIC PANEL
ALBUMIN: 4 g/dL (ref 3.5–5.2)
ALT: 14 U/L (ref 0–35)
AST: 21 U/L (ref 0–37)
Alkaline Phosphatase: 48 U/L (ref 39–117)
BUN: 20 mg/dL (ref 6–23)
CALCIUM: 9.2 mg/dL (ref 8.4–10.5)
CHLORIDE: 102 meq/L (ref 96–112)
CO2: 28 meq/L (ref 19–32)
Creat: 0.82 mg/dL (ref 0.50–1.10)
GLUCOSE: 85 mg/dL (ref 70–99)
Potassium: 4.4 mEq/L (ref 3.5–5.3)
Sodium: 138 mEq/L (ref 135–145)
Total Bilirubin: 0.6 mg/dL (ref 0.2–1.2)
Total Protein: 6.4 g/dL (ref 6.0–8.3)

## 2014-07-03 LAB — CBC WITH DIFFERENTIAL/PLATELET
Basophils Absolute: 0 10*3/uL (ref 0.0–0.1)
Basophils Relative: 1 % (ref 0–1)
EOS ABS: 0.1 10*3/uL (ref 0.0–0.7)
Eosinophils Relative: 2 % (ref 0–5)
HCT: 42.3 % (ref 36.0–46.0)
HEMOGLOBIN: 13.9 g/dL (ref 12.0–15.0)
LYMPHS PCT: 35 % (ref 12–46)
Lymphs Abs: 1.4 10*3/uL (ref 0.7–4.0)
MCH: 28.5 pg (ref 26.0–34.0)
MCHC: 32.9 g/dL (ref 30.0–36.0)
MCV: 86.7 fL (ref 78.0–100.0)
MPV: 10.9 fL (ref 8.6–12.4)
Monocytes Absolute: 0.5 10*3/uL (ref 0.1–1.0)
Monocytes Relative: 11 % (ref 3–12)
NEUTROS PCT: 51 % (ref 43–77)
Neutro Abs: 2.1 10*3/uL (ref 1.7–7.7)
Platelets: 175 10*3/uL (ref 150–400)
RBC: 4.88 MIL/uL (ref 3.87–5.11)
RDW: 14.2 % (ref 11.5–15.5)
WBC: 4.1 10*3/uL (ref 4.0–10.5)

## 2014-07-03 LAB — LIPID PANEL
Cholesterol: 194 mg/dL (ref 0–200)
HDL: 66 mg/dL (ref >39)
LDL CALC: 112 mg/dL — AB (ref 0–99)
Total CHOL/HDL Ratio: 2.9 Ratio
Triglycerides: 82 mg/dL (ref <150)
VLDL: 16 mg/dL (ref 0–40)

## 2014-07-03 LAB — TSH: TSH: 3.007 u[IU]/mL (ref 0.350–4.500)

## 2014-07-04 ENCOUNTER — Other Ambulatory Visit: Payer: Self-pay

## 2014-07-04 DIAGNOSIS — E559 Vitamin D deficiency, unspecified: Secondary | ICD-10-CM

## 2014-07-04 LAB — VITAMIN D 25 HYDROXY (VIT D DEFICIENCY, FRACTURES): VIT D 25 HYDROXY: 24 ng/mL — AB (ref 30–100)

## 2014-09-16 ENCOUNTER — Encounter: Payer: Self-pay | Admitting: Gynecology

## 2014-09-16 ENCOUNTER — Ambulatory Visit (INDEPENDENT_AMBULATORY_CARE_PROVIDER_SITE_OTHER): Payer: 59 | Admitting: Gynecology

## 2014-09-16 VITALS — BP 120/76

## 2014-09-16 DIAGNOSIS — N898 Other specified noninflammatory disorders of vagina: Secondary | ICD-10-CM | POA: Diagnosis not present

## 2014-09-16 DIAGNOSIS — R350 Frequency of micturition: Secondary | ICD-10-CM

## 2014-09-16 LAB — WET PREP FOR TRICH, YEAST, CLUE
Clue Cells Wet Prep HPF POC: NONE SEEN
Trich, Wet Prep: NONE SEEN
Yeast Wet Prep HPF POC: NONE SEEN

## 2014-09-16 LAB — URINALYSIS W MICROSCOPIC + REFLEX CULTURE
Bilirubin Urine: NEGATIVE
CASTS: NONE SEEN
CRYSTALS: NONE SEEN
Glucose, UA: NEGATIVE mg/dL
Ketones, ur: NEGATIVE mg/dL
Leukocytes, UA: NEGATIVE
Nitrite: NEGATIVE
Protein, ur: NEGATIVE mg/dL
Specific Gravity, Urine: 1.01 (ref 1.005–1.030)
Urobilinogen, UA: 0.2 mg/dL (ref 0.0–1.0)
WBC UA: NONE SEEN WBC/hpf (ref <3)
pH: 7 (ref 5.0–8.0)

## 2014-09-16 MED ORDER — METRONIDAZOLE 500 MG PO TABS: 500.0000 mg | ORAL_TABLET | Freq: Two times a day (BID) | ORAL | Status: DC

## 2014-09-16 NOTE — Addendum Note (Signed)
Addended by: Olevia Perches on: 09/16/2014 11:01 AM   Modules accepted: Orders

## 2014-09-16 NOTE — Progress Notes (Signed)
Sheila Strickland [birth year] [id number]        54 y.o.  G2P2002 With several days of slight frequency/urgency. Also heavier vaginal discharge. No odor or irritation. No low back pain fever chills or other symptoms. Has an appointment to see her urologist that she sees annually for microscopic hematuria follow up.  Past medical history,surgical history, problem list, medications, allergies, family history and social history were all reviewed and documented in the EPIC chart.  Directed ROS with pertinent positives and negatives documented in the history of present illness/assessment and plan.  Exam:  Kim assistant Filed Vitals:   09/16/14 0914  BP: 120/76   General appearance:  Normal Abdomen soft nontender without masses guarding rebound Pelvic external BUS vagina with thick white discharge. Bimanual without masses or tenderness.  Assessment/Plan:  54 y.o. [id number] with above history and exam. Wet prep overall was negative. Urinalysis shows few bacteria but otherwise negative. Has 0-2 RBCs. Will follow up with culture. Will treat as low-level bacterial vaginosis given the heavier discharge regardless of what prep being negative. Flagyl 500 mg twice a day 7 days. Alcohol avoidance reviewed. Follow up if symptoms persist, worsen or recur.    Anastasio Auerbach MD, 9:58 AM 09/16/2014

## 2014-09-16 NOTE — Patient Instructions (Signed)
Take the metronidazole medication twice daily for 7 days. Avoid alcohol while taking.

## 2014-09-18 LAB — URINE CULTURE
COLONY COUNT: NO GROWTH
Organism ID, Bacteria: NO GROWTH

## 2015-02-26 ENCOUNTER — Other Ambulatory Visit: Payer: Self-pay

## 2015-02-26 MED ORDER — LEVOTHYROXINE SODIUM 125 MCG PO TABS: 125.0000 ug | ORAL_TABLET | Freq: Every day | ORAL | Status: DC

## 2015-05-23 ENCOUNTER — Telehealth: Payer: Self-pay | Admitting: *Deleted

## 2015-05-23 MED ORDER — FLUCONAZOLE 150 MG PO TABS: 150.0000 mg | ORAL_TABLET | Freq: Every day | ORAL | Status: DC

## 2015-05-23 NOTE — Telephone Encounter (Signed)
Pt aware Rx sent.  

## 2015-05-23 NOTE — Telephone Encounter (Signed)
Diflucan 150 mg 1 okay

## 2015-05-23 NOTE — Telephone Encounter (Signed)
Pt called c/o yeast infecting itching and white discharge, has annual scheduled in Jan, unable to come in today. Pt asked if you would be able to send Rx in? Please advise

## 2015-06-05 ENCOUNTER — Telehealth: Payer: Self-pay | Admitting: *Deleted

## 2015-06-05 MED ORDER — FLUCONAZOLE 150 MG PO TABS: 150.0000 mg | ORAL_TABLET | Freq: Every day | ORAL | Status: DC

## 2015-06-05 NOTE — Telephone Encounter (Signed)
Okay to repeat the dose 1

## 2015-06-05 NOTE — Telephone Encounter (Signed)
Left message on voicemail, Rx sent.

## 2015-06-05 NOTE — Telephone Encounter (Signed)
Pt was given Rx for telephone encounter on telephone encounter 05/23/15 for diflucan 150 #1, took it, but feels like it is 97% better, but very small amount of irritation, annual scheduled on 06/25/15. Please advise

## 2015-06-19 ENCOUNTER — Encounter: Payer: Self-pay | Admitting: Gynecology

## 2015-06-25 ENCOUNTER — Ambulatory Visit (INDEPENDENT_AMBULATORY_CARE_PROVIDER_SITE_OTHER): Payer: 59 | Admitting: Gynecology

## 2015-06-25 ENCOUNTER — Encounter: Payer: Self-pay | Admitting: Gynecology

## 2015-06-25 VITALS — BP 120/74 | Ht 67.0 in | Wt 143.0 lb

## 2015-06-25 DIAGNOSIS — Z1321 Encounter for screening for nutritional disorder: Secondary | ICD-10-CM | POA: Diagnosis not present

## 2015-06-25 DIAGNOSIS — E038 Other specified hypothyroidism: Secondary | ICD-10-CM | POA: Diagnosis not present

## 2015-06-25 DIAGNOSIS — Z01419 Encounter for gynecological examination (general) (routine) without abnormal findings: Secondary | ICD-10-CM

## 2015-06-25 DIAGNOSIS — Z13220 Encounter for screening for lipoid disorders: Secondary | ICD-10-CM | POA: Diagnosis not present

## 2015-06-25 DIAGNOSIS — Z79890 Hormone replacement therapy (postmenopausal): Secondary | ICD-10-CM | POA: Diagnosis not present

## 2015-06-25 LAB — CBC WITH DIFFERENTIAL/PLATELET
BASOS PCT: 1 % (ref 0–1)
Basophils Absolute: 0 10*3/uL (ref 0.0–0.1)
EOS ABS: 0 10*3/uL (ref 0.0–0.7)
Eosinophils Relative: 1 % (ref 0–5)
HCT: 45 % (ref 36.0–46.0)
Hemoglobin: 14.6 g/dL (ref 12.0–15.0)
LYMPHS ABS: 1.6 10*3/uL (ref 0.7–4.0)
Lymphocytes Relative: 37 % (ref 12–46)
MCH: 29 pg (ref 26.0–34.0)
MCHC: 32.4 g/dL (ref 30.0–36.0)
MCV: 89.5 fL (ref 78.0–100.0)
MONO ABS: 0.3 10*3/uL (ref 0.1–1.0)
MPV: 11.4 fL (ref 8.6–12.4)
Monocytes Relative: 7 % (ref 3–12)
Neutro Abs: 2.4 10*3/uL (ref 1.7–7.7)
Neutrophils Relative %: 54 % (ref 43–77)
PLATELETS: 182 10*3/uL (ref 150–400)
RBC: 5.03 MIL/uL (ref 3.87–5.11)
RDW: 13.1 % (ref 11.5–15.5)
WBC: 4.4 10*3/uL (ref 4.0–10.5)

## 2015-06-25 LAB — COMPREHENSIVE METABOLIC PANEL
ALBUMIN: 4.2 g/dL (ref 3.6–5.1)
ALT: 17 U/L (ref 6–29)
AST: 20 U/L (ref 10–35)
Alkaline Phosphatase: 53 U/L (ref 33–130)
BUN: 23 mg/dL (ref 7–25)
CO2: 29 mmol/L (ref 20–31)
CREATININE: 0.77 mg/dL (ref 0.50–1.05)
Calcium: 9.5 mg/dL (ref 8.6–10.4)
Chloride: 100 mmol/L (ref 98–110)
Glucose, Bld: 80 mg/dL (ref 65–99)
POTASSIUM: 4.2 mmol/L (ref 3.5–5.3)
SODIUM: 136 mmol/L (ref 135–146)
TOTAL PROTEIN: 6.6 g/dL (ref 6.1–8.1)
Total Bilirubin: 0.9 mg/dL (ref 0.2–1.2)

## 2015-06-25 LAB — LIPID PANEL
CHOL/HDL RATIO: 2.6 ratio (ref <=5.0)
CHOLESTEROL: 185 mg/dL (ref 125–200)
HDL: 70 mg/dL (ref >=46)
LDL CALC: 100 mg/dL (ref <130)
TRIGLYCERIDES: 77 mg/dL (ref <150)
VLDL: 15 mg/dL (ref <30)

## 2015-06-25 LAB — TSH: TSH: 1.656 u[IU]/mL (ref 0.350–4.500)

## 2015-06-25 MED ORDER — ESTRADIOL 0.05 MG/24HR TD PTTW: MEDICATED_PATCH | TRANSDERMAL | Status: DC

## 2015-06-25 MED ORDER — LEVOTHYROXINE SODIUM 125 MCG PO TABS: 125.0000 ug | ORAL_TABLET | Freq: Every day | ORAL | Status: DC

## 2015-06-25 MED ORDER — IBUPROFEN 800 MG PO TABS: 800.0000 mg | ORAL_TABLET | Freq: Three times a day (TID) | ORAL | Status: DC | PRN

## 2015-06-25 NOTE — Progress Notes (Signed)
Sheila Strickland 05-Feb-1961 [id number]        55 y.o.  [id number]  for annual exam.  Several issues noted below.  Past medical history,surgical history, problem list, medications, allergies, family history and social history were all reviewed and documented as reviewed in the EPIC chart.  ROS:  Performed with pertinent positives and negatives included in the history, assessment and plan.   Additional significant findings :  none   Exam: Caryn Bee assistant Filed Vitals:   06/25/15 1208  BP: 120/74  Height: 5\' 7"  (1.702 m)  Weight: 143 lb (64.864 kg)   General appearance:  Normal affect, orientation and appearance. Skin: Grossly normal HEENT: Without gross lesions.  No cervical or supraclavicular adenopathy. Thyroid normal.  Lungs:  Clear without wheezing, rales or rhonchi Cardiac: RR, without RMG Abdominal:  Soft, nontender, without masses, guarding, rebound, organomegaly or hernia Breasts:  Examined lying and sitting without masses, retractions, discharge or axillary adenopathy. Pelvic:  Ext/BUS/vagina normal  Adnexa  Without masses or tenderness    Anus and perineum  Normal   Rectovaginal  Normal sphincter tone without palpated masses or tenderness.    Assessment/Plan:  55 y.o. G32P2002 female for annual exam.   1. Status post LAVH BSO 2015 for leiomyoma, menorrhagia, endometrial polyp. On Vivelle 0.05 mg patch doing well and wants to continue. History of Leiden factor V heterologous. We again discussed the whole issue of HRT as well as her probable higher risk of thrombosis such as stroke heart attack DVT. Also the issues of breast cancer discussed. After lengthy discussions patient wants to continue and I refilled her 1 year. 2. Pap smear/HPV negative 05/2012. No Pap smear done today. History of LEEP 2008 for CIN-1. Negative Pap smear since then. Status post hysterectomy for benign indications. Options to stop screening versus less frequent screening intervals reviewed. Will  readdress on an annual basis. 3. Mammography 06/2015. Continue with annual mammography when due. SBE monthly reviewed. 4. Colonoscopy 2013. Repeat at their recommended interval. 5. DEXA never. Will plan further into the menopause. Check vitamin D level today. 6. Hypothyroid. On Synthroid on 125 g. Refill 1 year provided. Check TSH today. 7. History of migraines with occasional ibuprofen 800 mg use. #60 with 3 refills provided. 8. Health maintenance. Baseline CBC, comprehensive metabolic panel, lipid profile, urinalysis, TSH, vitamin D ordered. Follow up in one year, sooner as needed.   Anastasio Auerbach MD, 12:33 PM 06/25/2015

## 2015-06-25 NOTE — Patient Instructions (Signed)

## 2015-06-26 LAB — URINALYSIS W MICROSCOPIC + REFLEX CULTURE
BACTERIA UA: NONE SEEN [HPF]
BILIRUBIN URINE: NEGATIVE
CASTS: NONE SEEN [LPF]
CRYSTALS: NONE SEEN [HPF]
Glucose, UA: NEGATIVE
Ketones, ur: NEGATIVE
Leukocytes, UA: NEGATIVE
Nitrite: NEGATIVE
Protein, ur: NEGATIVE
Specific Gravity, Urine: 1.003 (ref 1.001–1.035)
Squamous Epithelial / LPF: NONE SEEN [HPF] (ref <=5)
WBC, UA: NONE SEEN WBC/HPF (ref <=5)
YEAST: NONE SEEN [HPF]
pH: 6.5 (ref 5.0–8.0)

## 2015-06-26 LAB — VITAMIN D 25 HYDROXY (VIT D DEFICIENCY, FRACTURES): Vit D, 25-Hydroxy: 46 ng/mL (ref 30–100)

## 2015-06-27 ENCOUNTER — Other Ambulatory Visit: Payer: Self-pay | Admitting: Gynecology

## 2015-06-27 MED ORDER — SULFAMETHOXAZOLE-TRIMETHOPRIM 800-160 MG PO TABS: 1.0000 | ORAL_TABLET | Freq: Two times a day (BID) | ORAL | Status: DC

## 2015-06-28 LAB — URINE CULTURE: Colony Count: 40000

## 2015-12-17 ENCOUNTER — Encounter: Payer: Self-pay | Admitting: Gynecology

## 2015-12-17 ENCOUNTER — Ambulatory Visit (INDEPENDENT_AMBULATORY_CARE_PROVIDER_SITE_OTHER): Payer: 59 | Admitting: Gynecology

## 2015-12-17 VITALS — BP 114/64

## 2015-12-17 DIAGNOSIS — R350 Frequency of micturition: Secondary | ICD-10-CM | POA: Diagnosis not present

## 2015-12-17 DIAGNOSIS — R002 Palpitations: Secondary | ICD-10-CM

## 2015-12-17 LAB — URINALYSIS W MICROSCOPIC + REFLEX CULTURE
Bacteria, UA: NONE SEEN [HPF]
Bilirubin Urine: NEGATIVE
CRYSTALS: NONE SEEN [HPF]
Casts: NONE SEEN [LPF]
Glucose, UA: NEGATIVE
Ketones, ur: NEGATIVE
Leukocytes, UA: NEGATIVE
Nitrite: NEGATIVE
Protein, ur: NEGATIVE
Specific Gravity, Urine: <1.005 (ref 1.001–1.035)
WBC, UA: NONE SEEN WBC/HPF (ref <=5)
YEAST: NONE SEEN [HPF]
pH: 6 (ref 5.0–8.0)

## 2015-12-17 NOTE — Progress Notes (Addendum)
    Sheila Strickland 03-23-1961 [id number]        55 y.o.  [id number] presents complaining of some urinary frequency. No dysuria or urgency low back pain fever or chills. No vaginal discharge irritation or odor. Does have a history of benign hematuria and has an appointment to see her urologist as her routine follow up for this. Also notes sometimes at night she notes her heart pounding. No real tachycardia but just more prominent heartbeat. No chest pain or lightheadedness dizziness.  Past medical history,surgical history, problem list, medications, allergies, family history and social history were all reviewed and documented in the EPIC chart.  Directed ROS with pertinent positives and negatives documented in the history of present illness/assessment and plan.  Exam: Caryn Bee assistant Filed Vitals:   12/17/15 1006  BP: 114/64   General appearance:  Normal Lungs clear Cardiac regular rate without rubs murmurs or gallops. Pulse rate 74 Spine straight without CVA tenderness Abdomen soft nontender without masses guarding rebound Pelvic external BUS vagina normal. Bimanual without masses or tenderness  Assessment/Plan:  55 y.o. [id number] with:  1. Urinary frequency. Patient does know she is drinking more fluids. Her urine analysis is negative excepting 3-10 RBC. Will culture for completeness. Will monitor symptoms for now. If other symptoms arise or if culture shows bacteria them will treat. Follow up with urology as scheduled for her history of microscopic hematuria. 2. Heart pounding. Exam is normal. Blood pressure and pulse rate normal. Recommend patient follow up with primary physician if continues.  Otherwise it resolves and will follow.    Anastasio Auerbach MD, 10:43 AM 12/17/2015

## 2015-12-17 NOTE — Addendum Note (Signed)
Addended by: Nelva Nay on: 12/17/2015 12:06 PM   Modules accepted: Orders, SmartSet

## 2015-12-17 NOTE — Patient Instructions (Signed)
Office will call you if the urine grows out any bacteria. Follow up with your urologist as scheduled.

## 2015-12-19 ENCOUNTER — Other Ambulatory Visit: Payer: Self-pay | Admitting: Gynecology

## 2015-12-19 LAB — URINE CULTURE

## 2015-12-19 MED ORDER — AMPICILLIN 500 MG PO CAPS: 500.0000 mg | ORAL_CAPSULE | Freq: Four times a day (QID) | ORAL | Status: DC

## 2016-01-01 ENCOUNTER — Telehealth: Payer: Self-pay | Admitting: *Deleted

## 2016-01-01 MED ORDER — FLUCONAZOLE 150 MG PO TABS: ORAL_TABLET | ORAL | 0 refills | Status: DC

## 2016-01-01 NOTE — Telephone Encounter (Signed)
Left the below on pt voicemail, Rx sent.  

## 2016-01-01 NOTE — Telephone Encounter (Signed)
Okay for Diflucan 150 mg. Dispense 2  and tell her to take 1 now and save the other in case she has persistence or recurrence she'll have it with her on medication.

## 2016-01-01 NOTE — Telephone Encounter (Signed)
Pt was treated for UTI on 12/19/15 with ampicillin 500 mg 4 times a day 3 days. Pt now c/o vaginal itching only external x 2 days now. Pt will be leaving for 10 day vacation on Saturday, asked if diflucan tablet could be sent.? Please advise

## 2016-01-07 ENCOUNTER — Telehealth: Payer: Self-pay | Admitting: *Deleted

## 2016-01-07 ENCOUNTER — Other Ambulatory Visit: Payer: Self-pay | Admitting: Gynecology

## 2016-01-07 MED ORDER — TERCONAZOLE 0.8 % VA CREA: 1.0000 | TOPICAL_CREAM | Freq: Every day | VAGINAL | 0 refills | Status: DC

## 2016-01-07 NOTE — Telephone Encounter (Signed)
Pt informed and Rx sent KW CMA

## 2016-01-07 NOTE — Telephone Encounter (Signed)
Terazol 3 day cream. Applicator bedtime 3

## 2016-01-07 NOTE — Telephone Encounter (Signed)
Pt was treated over phone on 01/01/16 for yeast after taking a 10 day course of amoxicillin. Took Diflucan 01/01/16 and 7/30 and still has itching. Discharge not bad at all she states. States cannot come in for OV when offered. She is requesting another Diflucan to help with itching. Please advise. KW CMA

## 2016-06-16 ENCOUNTER — Encounter: Payer: Self-pay | Admitting: Gynecology

## 2016-06-25 ENCOUNTER — Encounter: Payer: Self-pay | Admitting: Gynecology

## 2016-06-25 ENCOUNTER — Telehealth: Payer: Self-pay | Admitting: *Deleted

## 2016-06-25 ENCOUNTER — Ambulatory Visit (INDEPENDENT_AMBULATORY_CARE_PROVIDER_SITE_OTHER): Payer: 59 | Admitting: Gynecology

## 2016-06-25 VITALS — BP 120/74 | Ht 67.0 in | Wt 145.0 lb

## 2016-06-25 DIAGNOSIS — Z01419 Encounter for gynecological examination (general) (routine) without abnormal findings: Secondary | ICD-10-CM

## 2016-06-25 DIAGNOSIS — Z13220 Encounter for screening for lipoid disorders: Secondary | ICD-10-CM | POA: Diagnosis not present

## 2016-06-25 DIAGNOSIS — Z79890 Hormone replacement therapy (postmenopausal): Secondary | ICD-10-CM | POA: Diagnosis not present

## 2016-06-25 DIAGNOSIS — Z1321 Encounter for screening for nutritional disorder: Secondary | ICD-10-CM

## 2016-06-25 DIAGNOSIS — E039 Hypothyroidism, unspecified: Secondary | ICD-10-CM | POA: Diagnosis not present

## 2016-06-25 LAB — CBC WITH DIFFERENTIAL/PLATELET
BASOS ABS: 0 {cells}/uL (ref 0–200)
Basophils Relative: 0 %
Eosinophils Absolute: 49 cells/uL (ref 15–500)
Eosinophils Relative: 1 %
HCT: 44.2 % (ref 35.0–45.0)
Hemoglobin: 14.6 g/dL (ref 11.7–15.5)
LYMPHS ABS: 1519 {cells}/uL (ref 850–3900)
LYMPHS PCT: 31 %
MCH: 29.3 pg (ref 27.0–33.0)
MCHC: 33 g/dL (ref 32.0–36.0)
MCV: 88.8 fL (ref 80.0–100.0)
MPV: 11.3 fL (ref 7.5–12.5)
Monocytes Absolute: 343 cells/uL (ref 200–950)
Monocytes Relative: 7 %
NEUTROS PCT: 61 %
Neutro Abs: 2989 cells/uL (ref 1500–7800)
Platelets: 207 10*3/uL (ref 140–400)
RBC: 4.98 MIL/uL (ref 3.80–5.10)
RDW: 13.1 % (ref 11.0–15.0)
WBC: 4.9 10*3/uL (ref 3.8–10.8)

## 2016-06-25 LAB — TSH: TSH: 1.06 mIU/L

## 2016-06-25 LAB — COMPREHENSIVE METABOLIC PANEL
ALBUMIN: 4.2 g/dL (ref 3.6–5.1)
ALT: 16 U/L (ref 6–29)
AST: 20 U/L (ref 10–35)
Alkaline Phosphatase: 52 U/L (ref 33–130)
BILIRUBIN TOTAL: 0.8 mg/dL (ref 0.2–1.2)
BUN: 23 mg/dL (ref 7–25)
CO2: 27 mmol/L (ref 20–31)
Calcium: 9.3 mg/dL (ref 8.6–10.4)
Chloride: 101 mmol/L (ref 98–110)
Creat: 0.76 mg/dL (ref 0.50–1.05)
Glucose, Bld: 87 mg/dL (ref 65–99)
POTASSIUM: 4.1 mmol/L (ref 3.5–5.3)
Sodium: 136 mmol/L (ref 135–146)
Total Protein: 6.7 g/dL (ref 6.1–8.1)

## 2016-06-25 LAB — LIPID PANEL
Cholesterol: 202 mg/dL — ABNORMAL HIGH (ref <200)
HDL: 69 mg/dL (ref >50)
LDL Cholesterol: 120 mg/dL — ABNORMAL HIGH (ref <100)
TRIGLYCERIDES: 66 mg/dL (ref <150)
Total CHOL/HDL Ratio: 2.9 Ratio (ref <5.0)
VLDL: 13 mg/dL (ref <30)

## 2016-06-25 MED ORDER — LEVOTHYROXINE SODIUM 125 MCG PO TABS: 125.0000 ug | ORAL_TABLET | Freq: Every day | ORAL | 4 refills | Status: DC

## 2016-06-25 MED ORDER — IBUPROFEN 800 MG PO TABS: 800.0000 mg | ORAL_TABLET | Freq: Three times a day (TID) | ORAL | 3 refills | Status: DC | PRN

## 2016-06-25 NOTE — Patient Instructions (Signed)

## 2016-06-25 NOTE — Telephone Encounter (Signed)
-----   Message from Anastasio Auerbach, MD sent at 06/25/2016  1:03 PM EST ----- Call the [facility] in [facility]. I sent through a prescription for Vivelle and ibuprofen and that is to be canceled. She wanted them sent to the mail order after she told me and I redirected them there but I want the [facility] ones canceled.

## 2016-06-25 NOTE — Progress Notes (Signed)
    PAIZLEY PLOTNER 1960-09-29 [id number]        56 y.o.  G2P2002 for annual exam.    Past medical history,surgical history, problem list, medications, allergies, family history and social history were all reviewed and documented as reviewed in the EPIC chart.  ROS:  Performed with pertinent positives and negatives included in the history, assessment and plan.   Additional significant findings :  None   Exam: Caryn Bee assistant Vitals:   06/25/16 1228  BP: 120/74  Weight: 145 lb (65.8 kg)  Height: 5\' 7"  (1.702 m)   Body mass index is 22.71 kg/m.  General appearance:  Normal affect, orientation and appearance. Skin: Grossly normal HEENT: Without gross lesions.  No cervical or supraclavicular adenopathy. Thyroid normal.  Lungs:  Clear without wheezing, rales or rhonchi Cardiac: RR, without RMG Abdominal:  Soft, nontender, without masses, guarding, rebound, organomegaly or hernia Breasts:  Examined lying and sitting without masses, retractions, discharge or axillary adenopathy. Pelvic:  Ext, BUS, Vagina with mild atrophic changes  Adnexa without masses or tenderness    Anus and perineum normal   Rectovaginal normal sphincter tone without palpated masses or tenderness.    Assessment/Plan:  56 y.o. [id number] female for annual exam.   1. History of LAVH BSO 2015 for leiomyoma/menorrhagia/endometrial polyp. On Vivelle 0.05 mg patches doing well and wants to continue. History of Leiden factor V heterologous. We again reviewed the risks of HRT to include a realistic understanding of thrombosis risks such as stroke heart attack DVT and also breast cancer. Benefits as far as symptom relief and possible cardiovascular/bone health when started early also reviewed. At this point patient wants to continue. Refill Vivelle 0.05 mg patch 1 year. 2. Hypothyroid. On Synthroid 125 g. Check TSH. Refill 1 year provided. 3. History of migraines with occasional ibuprofen 800 mg use. #60 with 3  refills provided. 4. DEXA never. Will plan further into the menopause. Check vitamin D level today. 5. Pap smear/HPV 05/2012. No Pap smear done today. History of LEEP 2008 for CIN-1. Normal Pap smears afterwards. Reviewed current screening guidelines. Options to stop screening based on hysterectomy history versus less frequent screening intervals reviewed. We'll rediscuss next year. 6. Mammography 06/2016. Continue with annual mammography when due. SBE monthly reviewed. 7. Colonoscopy 2013. Repeat at their recommended interval. 8. Health maintenance. Baseline CBC, CMP, lipid profile, TSH, vitamin D, urinalysis ordered. Follow up 1 year, sooner as needed.   Anastasio Auerbach MD, 12:57 PM 06/25/2016

## 2016-06-25 NOTE — Telephone Encounter (Signed)
Left on pharmacy voicemail to cancel Rx's

## 2016-06-26 LAB — URINALYSIS W MICROSCOPIC + REFLEX CULTURE
Bacteria, UA: NONE SEEN [HPF]
Bilirubin Urine: NEGATIVE
Casts: NONE SEEN [LPF]
Crystals: NONE SEEN [HPF]
GLUCOSE, UA: NEGATIVE
Ketones, ur: NEGATIVE
LEUKOCYTES UA: NEGATIVE
NITRITE: NEGATIVE
PH: 6 (ref 5.0–8.0)
Protein, ur: NEGATIVE
Specific Gravity, Urine: 1.013 (ref 1.001–1.035)
WBC, UA: NONE SEEN WBC/HPF (ref <=5)
YEAST: NONE SEEN [HPF]

## 2016-06-26 LAB — VITAMIN D 25 HYDROXY (VIT D DEFICIENCY, FRACTURES): Vit D, 25-Hydroxy: 45 ng/mL (ref 30–100)

## 2016-06-27 LAB — URINE CULTURE: ORGANISM ID, BACTERIA: NO GROWTH

## 2016-06-28 ENCOUNTER — Encounter: Payer: Self-pay | Admitting: Gynecology

## 2016-07-09 ENCOUNTER — Other Ambulatory Visit: Payer: Self-pay

## 2016-07-09 MED ORDER — ESTRADIOL 0.05 MG/24HR TD PTTW: MEDICATED_PATCH | TRANSDERMAL | 4 refills | Status: DC

## 2016-07-28 ENCOUNTER — Other Ambulatory Visit: Payer: Self-pay | Admitting: Gynecology

## 2016-08-03 ENCOUNTER — Telehealth: Payer: Self-pay | Admitting: *Deleted

## 2016-08-03 MED ORDER — FLUCONAZOLE 150 MG PO TABS
150.0000 mg | ORAL_TABLET | Freq: Once | ORAL | 0 refills | Status: DC
Start: 2016-08-03 — End: 2016-08-04

## 2016-08-03 NOTE — Telephone Encounter (Signed)
Pt informed, Rx sent. 

## 2016-08-03 NOTE — Telephone Encounter (Signed)
Diflucan 150 mg 1 dose okay 

## 2016-08-03 NOTE — Telephone Encounter (Signed)
Pt called c/o yeast infection itching and discharge, asked if Rx could be sent to pharmacy? Pt had annual in Jan 2018.  Please advise

## 2016-08-04 MED ORDER — FLUCONAZOLE 150 MG PO TABS
150.0000 mg | ORAL_TABLET | Freq: Once | ORAL | 0 refills | Status: AC
Start: 2016-08-04 — End: 2016-08-04

## 2016-08-04 NOTE — Telephone Encounter (Signed)
Pt called back today stating diflucan Rx was not at pharmacy, somehow the Rx did not get though yesterday at the pharmacy. Rx sent today.

## 2016-08-09 ENCOUNTER — Telehealth: Payer: Self-pay | Admitting: *Deleted

## 2016-08-09 MED ORDER — FLUCONAZOLE 150 MG PO TABS: 150.0000 mg | ORAL_TABLET | Freq: Once | ORAL | 0 refills | Status: AC

## 2016-08-09 NOTE — Telephone Encounter (Signed)
Diflucan 150 x 1 dose  tablet was sent to pharmacy for pt on 08/03/16, pt said still has itching only. Asked if another tablet could be sent? Please advise

## 2016-08-09 NOTE — Telephone Encounter (Signed)
Rx sent, pt aware 

## 2016-08-09 NOTE — Telephone Encounter (Signed)
Okay for second tablet of Diflucan 150 mg 1 dose

## 2016-08-23 ENCOUNTER — Ambulatory Visit (INDEPENDENT_AMBULATORY_CARE_PROVIDER_SITE_OTHER): Payer: 59 | Admitting: Gynecology

## 2016-08-23 ENCOUNTER — Encounter: Payer: Self-pay | Admitting: Gynecology

## 2016-08-23 VITALS — BP 120/80

## 2016-08-23 DIAGNOSIS — N898 Other specified noninflammatory disorders of vagina: Secondary | ICD-10-CM

## 2016-08-23 DIAGNOSIS — N760 Acute vaginitis: Secondary | ICD-10-CM

## 2016-08-23 DIAGNOSIS — L298 Other pruritus: Secondary | ICD-10-CM

## 2016-08-23 LAB — WET PREP FOR TRICH, YEAST, CLUE
Clue Cells Wet Prep HPF POC: NONE SEEN
TRICH WET PREP: NONE SEEN
YEAST WET PREP: NONE SEEN

## 2016-08-23 MED ORDER — TERCONAZOLE 0.8 % VA CREA: 1.0000 | TOPICAL_CREAM | Freq: Every day | VAGINAL | 0 refills | Status: DC

## 2016-08-23 NOTE — Progress Notes (Signed)
    Sheila Strickland [birth year] [id number]        56 y.o.  [id number] presents having been treated twice on the phone with Diflucan 150 mg for vaginal irritation. Seem to have cleared both times but now returns with last treatment rendered 08/09/2016. No significant discharge or odor. No urinary symptoms such as frequency dysuria urgency fevers chills nausea vomiting diarrhea constipation.  Past medical history,surgical history, problem list, medications, allergies, family history and social history were all reviewed and documented in the EPIC chart.  Directed ROS with pertinent positives and negatives documented in the history of present illness/assessment and plan.  Exam: Copywriter, advertising Vitals:   08/23/16 1211  BP: 120/80   General appearance:  Normal Abdomen soft nontender without masses guarding rebound Pelvic external BUS vagina with white discharge. Bimanual exam without masses or tenderness.  Assessment/Plan:  56 y.o. [id number] with history as above. Wet prep is unremarkable. Will cover for resistant yeast with Terazol 3 day cream nightly 3 nights. Follow up if symptoms persist, worsen or recur.    Anastasio Auerbach MD, 12:18 PM 08/23/2016

## 2016-08-23 NOTE — Patient Instructions (Signed)
Applicator nightly for 3 nights.

## 2017-06-29 DIAGNOSIS — Z1231 Encounter for screening mammogram for malignant neoplasm of breast: Secondary | ICD-10-CM | POA: Diagnosis not present

## 2017-07-04 ENCOUNTER — Encounter: Payer: Self-pay | Admitting: Gynecology

## 2017-07-04 ENCOUNTER — Ambulatory Visit (INDEPENDENT_AMBULATORY_CARE_PROVIDER_SITE_OTHER): Payer: 59 | Admitting: Gynecology

## 2017-07-04 VITALS — BP 120/74 | Ht 67.0 in | Wt 138.0 lb

## 2017-07-04 DIAGNOSIS — Z13220 Encounter for screening for lipoid disorders: Secondary | ICD-10-CM | POA: Diagnosis not present

## 2017-07-04 DIAGNOSIS — Z01411 Encounter for gynecological examination (general) (routine) with abnormal findings: Secondary | ICD-10-CM | POA: Diagnosis not present

## 2017-07-04 DIAGNOSIS — Z79890 Hormone replacement therapy (postmenopausal): Secondary | ICD-10-CM | POA: Diagnosis not present

## 2017-07-04 DIAGNOSIS — E039 Hypothyroidism, unspecified: Secondary | ICD-10-CM | POA: Diagnosis not present

## 2017-07-04 DIAGNOSIS — N952 Postmenopausal atrophic vaginitis: Secondary | ICD-10-CM

## 2017-07-04 DIAGNOSIS — Z1321 Encounter for screening for nutritional disorder: Secondary | ICD-10-CM

## 2017-07-04 MED ORDER — IBUPROFEN 800 MG PO TABS: 800.0000 mg | ORAL_TABLET | Freq: Three times a day (TID) | ORAL | 3 refills | Status: DC | PRN

## 2017-07-04 MED ORDER — LEVOTHYROXINE SODIUM 125 MCG PO TABS: 125.0000 ug | ORAL_TABLET | Freq: Every day | ORAL | 4 refills | Status: DC

## 2017-07-04 MED ORDER — ESTRADIOL 0.05 MG/24HR TD PTTW: MEDICATED_PATCH | TRANSDERMAL | 4 refills | Status: DC

## 2017-07-04 NOTE — Addendum Note (Signed)
Addended by: Nelva Nay on: 07/04/2017 11:26 AM   Modules accepted: Orders

## 2017-07-04 NOTE — Progress Notes (Signed)
    Sheila Strickland Oct 26, 1960 [id number]        57 y.o.  [id number] for annual gynecologic exam.  Doing well from a gynecologic standpoint.  Past medical history,surgical history, problem list, medications, allergies, family history and social history were all reviewed and documented as reviewed in the EPIC chart.  ROS:  Performed with pertinent positives and negatives included in the history, assessment and plan.   Additional significant findings : None   Exam: Caryn Bee assistant Vitals:   07/04/17 0947  BP: 120/74  Weight: 138 lb (62.6 kg)  Height: 5\' 7"  (1.702 m)   Body mass index is 21.61 kg/m.  General appearance:  Normal affect, orientation and appearance. Skin: Grossly normal HEENT: Without gross lesions.  No cervical or supraclavicular adenopathy. Thyroid normal.  Lungs:  Clear without wheezing, rales or rhonchi Cardiac: RR, without RMG Abdominal:  Soft, nontender, without masses, guarding, rebound, organomegaly or hernia Breasts:  Examined lying and sitting without masses, retractions, discharge or axillary adenopathy. Pelvic:  Ext, BUS, Vagina: Normal.  Pap smear of vaginal cuff.  Adnexa: Without masses or tenderness    Anus and perineum: Normal   Rectovaginal: Normal sphincter tone without palpated masses or tenderness.    Assessment/Plan:  57 y.o. G73P2002 female for annual gynecologic exam status post LAVH BSO 2015 for leiomyoma/menorrhagia/endometrial polyp.   1. Postmenopausal/HRT.  Continues on Vivelle 0.05 mg patch.  History of heterologous Leiden factor V.  No personal history of thrombosis.  I again reviewed the risks of HRT to include a realistic understanding of increased risk of thrombosis.  Also the breast cancer issue was discussed.  At this point the patient desires to continue.  Refill times 1 year provided.  We did review benefits of transdermal from a thrombosis standpoint and her lower dose. 2. DEXA never.  Will plan further into the menopause.   Check vitamin D level today. 3. Hypothyroid.  On Synthroid 125 mcg.  Check TSH today.  Refill times 1 year provided. 4. Mammography 06/2017.  Continue with annual mammography next year.  Breast exam normal today. 5. Colonoscopy 2013.  Repeat at their recommended interval. 6. Ibuprofen 800 mg for headaches requested.  #60 with several refills provided. 7. Pap smear/HPV 05/2012.  Pap smear of vaginal cuff today.  History of LEEP 2008 for CIN-1.  Normal Pap smears afterwards.  Options to stop screening per current screening guidelines based on hysterectomy reviewed.  Will readdress on an annual basis. 8. Health maintenance.  Baseline CBC, CMP, lipid profile, TSH, vitamin D and urine analysis ordered.  Follow-up 1 year, sooner as needed.   Anastasio Auerbach MD, 10:33 AM 07/04/2017

## 2017-07-04 NOTE — Patient Instructions (Signed)
Follow-up in 1 year, sooner as needed. 

## 2017-07-05 LAB — CBC WITH DIFFERENTIAL/PLATELET
Basophils Absolute: 52 cells/uL (ref 0–200)
Basophils Relative: 1 %
Eosinophils Absolute: 42 cells/uL (ref 15–500)
Eosinophils Relative: 0.8 %
HEMATOCRIT: 43.8 % (ref 35.0–45.0)
HEMOGLOBIN: 14.9 g/dL (ref 11.7–15.5)
LYMPHS ABS: 1321 {cells}/uL (ref 850–3900)
MCH: 29.7 pg (ref 27.0–33.0)
MCHC: 34 g/dL (ref 32.0–36.0)
MCV: 87.3 fL (ref 80.0–100.0)
MPV: 12.2 fL (ref 7.5–12.5)
Monocytes Relative: 7.6 %
NEUTROS ABS: 3390 {cells}/uL (ref 1500–7800)
NEUTROS PCT: 65.2 %
Platelets: 191 10*3/uL (ref 140–400)
RBC: 5.02 10*6/uL (ref 3.80–5.10)
RDW: 11.9 % (ref 11.0–15.0)
Total Lymphocyte: 25.4 %
WBC: 5.2 10*3/uL (ref 3.8–10.8)
WBCMIX: 395 {cells}/uL (ref 200–950)

## 2017-07-05 LAB — VITAMIN D 25 HYDROXY (VIT D DEFICIENCY, FRACTURES): VIT D 25 HYDROXY: 45 ng/mL (ref 30–100)

## 2017-07-05 LAB — TSH: TSH: 1.68 mIU/L (ref 0.40–4.50)

## 2017-07-05 LAB — LIPID PANEL
Cholesterol: 190 mg/dL (ref <200)
HDL: 69 mg/dL (ref >50)
LDL Cholesterol (Calc): 105 mg/dL (calc) — ABNORMAL HIGH
Non-HDL Cholesterol (Calc): 121 mg/dL (calc) (ref <130)
Total CHOL/HDL Ratio: 2.8 (calc) (ref <5.0)
Triglycerides: 69 mg/dL (ref <150)

## 2017-07-05 LAB — COMPREHENSIVE METABOLIC PANEL
AG RATIO: 1.8 (calc) (ref 1.0–2.5)
ALBUMIN MSPROF: 4.4 g/dL (ref 3.6–5.1)
ALKALINE PHOSPHATASE (APISO): 50 U/L (ref 33–130)
ALT: 17 U/L (ref 6–29)
AST: 22 U/L (ref 10–35)
BILIRUBIN TOTAL: 0.9 mg/dL (ref 0.2–1.2)
BUN: 19 mg/dL (ref 7–25)
CALCIUM: 9.1 mg/dL (ref 8.6–10.4)
CHLORIDE: 101 mmol/L (ref 98–110)
CO2: 30 mmol/L (ref 20–32)
Creat: 0.85 mg/dL (ref 0.50–1.05)
GLOBULIN: 2.4 g/dL (ref 1.9–3.7)
Glucose, Bld: 85 mg/dL (ref 65–99)
POTASSIUM: 4.5 mmol/L (ref 3.5–5.3)
Sodium: 136 mmol/L (ref 135–146)
TOTAL PROTEIN: 6.8 g/dL (ref 6.1–8.1)

## 2017-07-06 LAB — URINALYSIS, COMPLETE W/RFL CULTURE
Bacteria, UA: NONE SEEN /HPF
Bilirubin Urine: NEGATIVE
GLUCOSE, UA: NEGATIVE
HYALINE CAST: NONE SEEN /LPF
Ketones, ur: NEGATIVE
Leukocyte Esterase: NEGATIVE
Nitrites, Initial: NEGATIVE
PROTEIN: NEGATIVE
Specific Gravity, Urine: 1.016 (ref 1.001–1.03)
Squamous Epithelial / LPF: NONE SEEN /HPF (ref <=5)
WBC, UA: NONE SEEN /HPF (ref 0–5)
pH: 6.5 (ref 5.0–8.0)

## 2017-07-06 LAB — URINE CULTURE
MICRO NUMBER:: 90115859
SPECIMEN QUALITY:: ADEQUATE

## 2017-07-06 LAB — PAP IG W/ RFLX HPV ASCU

## 2017-07-06 LAB — CULTURE INDICATED

## 2017-08-01 ENCOUNTER — Telehealth: Payer: Self-pay | Admitting: *Deleted

## 2017-08-01 MED ORDER — LEVOTHYROXINE SODIUM 125 MCG PO TABS: 125.0000 ug | ORAL_TABLET | Freq: Every day | ORAL | 4 refills | Status: DC

## 2017-08-01 MED ORDER — IBUPROFEN 800 MG PO TABS: 800.0000 mg | ORAL_TABLET | Freq: Three times a day (TID) | ORAL | 3 refills | Status: DC | PRN

## 2017-08-01 NOTE — Telephone Encounter (Signed)
Pt called requesting synthroid 125 mcg and motrin 800 mg sent to mail order from [facility] on 07/04/17. Rx sent, pt aware.

## 2017-08-02 DIAGNOSIS — M1612 Unilateral primary osteoarthritis, left hip: Secondary | ICD-10-CM | POA: Diagnosis not present

## 2017-08-02 DIAGNOSIS — M76892 Other specified enthesopathies of left lower limb, excluding foot: Secondary | ICD-10-CM | POA: Diagnosis not present

## 2017-09-02 ENCOUNTER — Other Ambulatory Visit: Payer: Self-pay | Admitting: Gynecology

## 2017-09-02 MED ORDER — LEVOTHYROXINE SODIUM 125 MCG PO TABS: 125.0000 ug | ORAL_TABLET | Freq: Every day | ORAL | 4 refills | Status: DC

## 2017-09-02 NOTE — Addendum Note (Signed)
Addended by: Thamas Jaegers on: 09/02/2017 11:17 AM   Modules accepted: Orders

## 2017-09-02 NOTE — Telephone Encounter (Signed)
Pt called back stating pharmacy never received Rx for synthroid 125 mcg. Rx re-sent.

## 2017-10-03 DIAGNOSIS — S73199A Other sprain of unspecified hip, initial encounter: Secondary | ICD-10-CM | POA: Insufficient documentation

## 2017-10-03 DIAGNOSIS — M24159 Other articular cartilage disorders, unspecified hip: Secondary | ICD-10-CM | POA: Diagnosis not present

## 2017-10-05 DIAGNOSIS — M24159 Other articular cartilage disorders, unspecified hip: Secondary | ICD-10-CM | POA: Diagnosis not present

## 2017-10-12 DIAGNOSIS — M24159 Other articular cartilage disorders, unspecified hip: Secondary | ICD-10-CM | POA: Diagnosis not present

## 2017-11-02 DIAGNOSIS — E038 Other specified hypothyroidism: Secondary | ICD-10-CM | POA: Diagnosis not present

## 2017-11-02 DIAGNOSIS — R0789 Other chest pain: Secondary | ICD-10-CM | POA: Diagnosis not present

## 2017-11-02 DIAGNOSIS — R002 Palpitations: Secondary | ICD-10-CM | POA: Diagnosis not present

## 2017-11-14 DIAGNOSIS — R002 Palpitations: Secondary | ICD-10-CM | POA: Diagnosis not present

## 2017-11-23 DIAGNOSIS — L570 Actinic keratosis: Secondary | ICD-10-CM | POA: Diagnosis not present

## 2017-12-02 DIAGNOSIS — R9389 Abnormal findings on diagnostic imaging of other specified body structures: Secondary | ICD-10-CM | POA: Diagnosis not present

## 2018-01-06 ENCOUNTER — Ambulatory Visit (INDEPENDENT_AMBULATORY_CARE_PROVIDER_SITE_OTHER): Payer: 59 | Admitting: Gynecology

## 2018-01-06 ENCOUNTER — Encounter: Payer: Self-pay | Admitting: *Deleted

## 2018-01-06 VITALS — BP 122/82

## 2018-01-06 DIAGNOSIS — N898 Other specified noninflammatory disorders of vagina: Secondary | ICD-10-CM

## 2018-01-06 DIAGNOSIS — N3000 Acute cystitis without hematuria: Secondary | ICD-10-CM | POA: Diagnosis not present

## 2018-01-06 LAB — WET PREP FOR TRICH, YEAST, CLUE

## 2018-01-06 MED ORDER — SULFAMETHOXAZOLE-TRIMETHOPRIM 800-160 MG PO TABS: 1.0000 | ORAL_TABLET | Freq: Two times a day (BID) | ORAL | 0 refills | Status: DC

## 2018-01-06 NOTE — Progress Notes (Signed)
    Sheila Strickland 12/17/1960 [id number]        57 y.o.  G2P2002 presents with 1 to 2 weeks of frequency.  Going more frequent than she is used to.  No real dysuria low back pain fever or chills.  Also notes a slight discharge with slight irritation.  No itching or odor.  Past medical history,surgical history, problem list, medications, allergies, family history and social history were all reviewed and documented in the EPIC chart.  Directed ROS with pertinent positives and negatives documented in the history of present illness/assessment and plan.  Exam: Sheila Strickland assistant Vitals:   01/06/18 0909  BP: 122/82   General appearance:  Normal Spine straight without CVA tenderness Abdomen soft nontender without masses guarding rebound Pelvic external BUS vagina normal with whitish discharge.  Bimanual exam without masses or tenderness.  Assessment/Plan:  58 y.o. N1Z0017 with history and exam as above.  Urine analysis suggests a low-grade UTI with bacteriuria.  She does have some hematuria but has been evaluated for benign hematuria in the past.  Wet prep was negative.  Will cover with Septra DS 1 p.o. twice daily x3 days.  Follow-up if any of her symptoms persist, worsen or recur.    Sheila Auerbach MD, 9:26 AM 01/06/2018

## 2018-01-06 NOTE — Patient Instructions (Signed)
Take the prescribed antibiotic twice daily for 3 days.  Follow-up if your symptoms persist

## 2018-01-06 NOTE — Addendum Note (Signed)
Addended by: Janalyn Harder A on: 01/06/2018 10:20 AM   Modules accepted: Orders

## 2018-01-09 LAB — URINALYSIS, COMPLETE W/RFL CULTURE
Bilirubin Urine: NEGATIVE
Glucose, UA: NEGATIVE
HYALINE CAST: NONE SEEN /LPF
Ketones, ur: NEGATIVE
LEUKOCYTE ESTERASE: NEGATIVE
NITRITES URINE, INITIAL: NEGATIVE
PROTEIN: NEGATIVE
Specific Gravity, Urine: 1.02 (ref 1.001–1.03)
WBC UA: NONE SEEN /HPF (ref 0–5)
pH: 5.5 (ref 5.0–8.0)

## 2018-01-09 LAB — URINE CULTURE
MICRO NUMBER:: 90920645
SPECIMEN QUALITY: ADEQUATE

## 2018-01-09 LAB — CULTURE INDICATED

## 2018-03-06 ENCOUNTER — Other Ambulatory Visit: Payer: Self-pay

## 2018-03-06 MED ORDER — ESTRADIOL 0.05 MG/24HR TD PTTW: MEDICATED_PATCH | TRANSDERMAL | 1 refills | Status: DC

## 2018-03-06 NOTE — Telephone Encounter (Signed)
Mail order pharmacy requested Rx.

## 2018-04-03 DIAGNOSIS — C44629 Squamous cell carcinoma of skin of left upper limb, including shoulder: Secondary | ICD-10-CM | POA: Diagnosis not present

## 2018-04-03 DIAGNOSIS — Z85828 Personal history of other malignant neoplasm of skin: Secondary | ICD-10-CM | POA: Diagnosis not present

## 2018-04-03 DIAGNOSIS — L570 Actinic keratosis: Secondary | ICD-10-CM | POA: Diagnosis not present

## 2018-04-03 DIAGNOSIS — L821 Other seborrheic keratosis: Secondary | ICD-10-CM | POA: Diagnosis not present

## 2018-04-03 DIAGNOSIS — C44529 Squamous cell carcinoma of skin of other part of trunk: Secondary | ICD-10-CM | POA: Diagnosis not present

## 2018-04-10 ENCOUNTER — Other Ambulatory Visit: Payer: Self-pay | Admitting: Urology

## 2018-04-10 DIAGNOSIS — R3121 Asymptomatic microscopic hematuria: Secondary | ICD-10-CM | POA: Diagnosis not present

## 2018-04-10 DIAGNOSIS — K824 Cholesterolosis of gallbladder: Secondary | ICD-10-CM | POA: Diagnosis not present

## 2018-04-17 ENCOUNTER — Ambulatory Visit
Admission: RE | Admit: 2018-04-17 | Discharge: 2018-04-17 | Disposition: A | Discharge status: Home / Self Care | Payer: 59 | Source: Ambulatory Visit | Attending: Urology | Admitting: Urology

## 2018-04-17 DIAGNOSIS — D1801 Hemangioma of skin and subcutaneous tissue: Secondary | ICD-10-CM | POA: Diagnosis not present

## 2018-04-17 DIAGNOSIS — K824 Cholesterolosis of gallbladder: Secondary | ICD-10-CM | POA: Diagnosis not present

## 2018-07-05 ENCOUNTER — Telehealth: Payer: Self-pay | Admitting: *Deleted

## 2018-07-05 ENCOUNTER — Ambulatory Visit (INDEPENDENT_AMBULATORY_CARE_PROVIDER_SITE_OTHER): Payer: 59 | Admitting: Gynecology

## 2018-07-05 ENCOUNTER — Encounter: Payer: Self-pay | Admitting: Gynecology

## 2018-07-05 VITALS — BP 116/76 | Ht 67.0 in | Wt 143.0 lb

## 2018-07-05 DIAGNOSIS — Z13220 Encounter for screening for lipoid disorders: Secondary | ICD-10-CM

## 2018-07-05 DIAGNOSIS — N952 Postmenopausal atrophic vaginitis: Secondary | ICD-10-CM | POA: Diagnosis not present

## 2018-07-05 DIAGNOSIS — N644 Mastodynia: Secondary | ICD-10-CM

## 2018-07-05 DIAGNOSIS — E038 Other specified hypothyroidism: Secondary | ICD-10-CM

## 2018-07-05 DIAGNOSIS — Z79890 Hormone replacement therapy (postmenopausal): Secondary | ICD-10-CM

## 2018-07-05 DIAGNOSIS — Z1321 Encounter for screening for nutritional disorder: Secondary | ICD-10-CM

## 2018-07-05 DIAGNOSIS — Z01419 Encounter for gynecological examination (general) (routine) without abnormal findings: Secondary | ICD-10-CM

## 2018-07-05 MED ORDER — LEVOTHYROXINE SODIUM 125 MCG PO TABS: 125.0000 ug | ORAL_TABLET | Freq: Every day | ORAL | 4 refills | Status: DC

## 2018-07-05 MED ORDER — IBUPROFEN 800 MG PO TABS: 800.0000 mg | ORAL_TABLET | Freq: Three times a day (TID) | ORAL | 3 refills | Status: DC | PRN

## 2018-07-05 NOTE — Progress Notes (Signed)
    Sheila Strickland [birth year] [id number]        58 y.o.  [id number] for annual gynecologic exam.  Notes today she has point tenderness in her left breast and thinks she feels a thickening.  No history of same before.  Has screening mammogram scheduled later today.  Past medical history,surgical history, problem list, medications, allergies, family history and social history were all reviewed and documented as reviewed in the EPIC chart.  ROS:  Performed with pertinent positives and negatives included in the history, assessment and plan.   Additional significant findings : None   Exam: Caryn Bee assistant Vitals:   07/05/18 0904  BP: 116/76  Weight: 143 lb (64.9 kg)  Height: 5\' 7"  (1.702 m)   Body mass index is 22.4 kg/m.  General appearance:  Normal affect, orientation and appearance. Skin: Grossly normal HEENT: Without gross lesions.  No cervical or supraclavicular adenopathy. Thyroid normal.  Lungs:  Clear without wheezing, rales or rhonchi Cardiac: RR, without RMG Abdominal:  Soft, nontender, without masses, guarding, rebound, organomegaly or hernia Breasts:  Examined lying and sitting without masses, retractions, discharge or axillary adenopathy.  The area the patient is pointing to is at 9:00 left breast off areola.  No palpable abnormalities. Pelvic:  Ext, BUS, Vagina: With atrophic changes  Adnexa: Without masses or tenderness    Anus and perineum: Normal   Rectovaginal: Normal sphincter tone without palpated masses or tenderness.    Assessment/Plan:  58 y.o. G37P2002 female for annual gynecologic exam.  Status post LAVH BSO 2015 for leiomyoma menorrhagia endometrial polyp.  1. Postmenopausal/HRT.  Continues on Vivelle 0.05 mg patches.  Has tried extending longer before replacing and has had hot flushes and sweats.  History of Leiden factor V heterologous with no personal history of thrombosis.  We again had a realistic discussion of increased risk of thrombosis  associated with Leiden factor V HRT.  At this point we discussed weaning and she is going to try this.  She will start with one half patch twice weekly and then discontinue in a month or 2.  If she does not tolerate the lower dose or stopping we discussed decreasing her estrogen dose to 0.0375 twice weekly.  She will call with results of her weaning attempt.  Breast cancer issue and HRT also discussed. 2. Tenderness left breast.  No palpable abnormalities on physician exam.  We will proceed with diagnostic mammography and ultrasound of this area.  Will make arrangements for this instead of her screening mammogram. 3. Hypothyroid.  Check TSH today.  Refill Synthroid 125 mcg 4. Ibuprofen 800 mg for headaches.  #60 with several refills provided. 5. DEXA never.  Will plan at age 20. 73. Pap smear 2019.  No Pap smear done today.  History of LEEP 2008 for CIN-1.  Normal Pap smears since. 7. Colonoscopy 2013.  Repeat at their recommended interval. 8. Health maintenance.  Baseline CBC, CMP, lipid profile, TSH and vitamin D ordered.  Follow-up in 1 year, sooner as needed.   Anastasio Auerbach MD, 9:38 AM 07/05/2018

## 2018-07-05 NOTE — Patient Instructions (Signed)
Follow-up for the diagnostic mammography and ultrasound of the breast as arranged.  Wean from the hormone replacement as we discussed.  Call me with any issues.

## 2018-07-05 NOTE — Telephone Encounter (Signed)
Per note on 07/05/18 "Tenderness left breast.  No palpable abnormalities on physician exam.  We will proceed with diagnostic mammography and ultrasound of this area.  Will make arrangements for this instead of her screening mammogram."  Patient scheduled on [facility] 07/13/18 @ 8:15am order faxed, patient informed.

## 2018-07-06 LAB — COMPREHENSIVE METABOLIC PANEL
AG RATIO: 2 (calc) (ref 1.0–2.5)
ALT: 16 U/L (ref 6–29)
AST: 21 U/L (ref 10–35)
Albumin: 4.4 g/dL (ref 3.6–5.1)
Alkaline phosphatase (APISO): 47 U/L (ref 33–130)
BUN: 24 mg/dL (ref 7–25)
CALCIUM: 9.7 mg/dL (ref 8.6–10.4)
CHLORIDE: 102 mmol/L (ref 98–110)
CO2: 27 mmol/L (ref 20–32)
Creat: 0.77 mg/dL (ref 0.50–1.05)
GLOBULIN: 2.2 g/dL (ref 1.9–3.7)
Glucose, Bld: 89 mg/dL (ref 65–99)
Potassium: 5.4 mmol/L — ABNORMAL HIGH (ref 3.5–5.3)
Sodium: 139 mmol/L (ref 135–146)
Total Bilirubin: 0.7 mg/dL (ref 0.2–1.2)
Total Protein: 6.6 g/dL (ref 6.1–8.1)

## 2018-07-06 LAB — LIPID PANEL
CHOLESTEROL: 199 mg/dL (ref <200)
HDL: 66 mg/dL (ref >50)
LDL Cholesterol (Calc): 119 mg/dL (calc) — ABNORMAL HIGH
NON-HDL CHOLESTEROL (CALC): 133 mg/dL — AB (ref <130)
Total CHOL/HDL Ratio: 3 (calc) (ref <5.0)
Triglycerides: 58 mg/dL (ref <150)

## 2018-07-06 LAB — CBC WITH DIFFERENTIAL/PLATELET
Absolute Monocytes: 398 cells/uL (ref 200–950)
BASOS ABS: 39 {cells}/uL (ref 0–200)
Basophils Relative: 0.7 %
EOS PCT: 5.9 %
Eosinophils Absolute: 330 cells/uL (ref 15–500)
HCT: 44.5 % (ref 35.0–45.0)
Hemoglobin: 14.5 g/dL (ref 11.7–15.5)
Lymphs Abs: 1310 cells/uL (ref 850–3900)
MCH: 28.8 pg (ref 27.0–33.0)
MCHC: 32.6 g/dL (ref 32.0–36.0)
MCV: 88.3 fL (ref 80.0–100.0)
MPV: 12.3 fL (ref 7.5–12.5)
Monocytes Relative: 7.1 %
NEUTROS PCT: 62.9 %
Neutro Abs: 3522 cells/uL (ref 1500–7800)
Platelets: 164 10*3/uL (ref 140–400)
RBC: 5.04 10*6/uL (ref 3.80–5.10)
RDW: 12.2 % (ref 11.0–15.0)
Total Lymphocyte: 23.4 %
WBC: 5.6 10*3/uL (ref 3.8–10.8)

## 2018-07-06 LAB — TSH: TSH: 1.85 m[IU]/L (ref 0.40–4.50)

## 2018-07-06 LAB — VITAMIN D 25 HYDROXY (VIT D DEFICIENCY, FRACTURES): Vit D, 25-Hydroxy: 33 ng/mL (ref 30–100)

## 2018-07-10 ENCOUNTER — Encounter: Payer: Self-pay | Admitting: Gynecology

## 2018-07-28 ENCOUNTER — Encounter: Payer: Self-pay | Admitting: Gynecology

## 2018-07-28 DIAGNOSIS — N6459 Other signs and symptoms in breast: Secondary | ICD-10-CM | POA: Diagnosis not present

## 2018-07-28 DIAGNOSIS — Z803 Family history of malignant neoplasm of breast: Secondary | ICD-10-CM | POA: Diagnosis not present

## 2018-08-02 DIAGNOSIS — L718 Other rosacea: Secondary | ICD-10-CM | POA: Diagnosis not present

## 2018-08-02 DIAGNOSIS — L570 Actinic keratosis: Secondary | ICD-10-CM | POA: Diagnosis not present

## 2018-08-02 DIAGNOSIS — D1801 Hemangioma of skin and subcutaneous tissue: Secondary | ICD-10-CM | POA: Diagnosis not present

## 2018-08-02 DIAGNOSIS — Z85828 Personal history of other malignant neoplasm of skin: Secondary | ICD-10-CM | POA: Diagnosis not present

## 2018-08-03 ENCOUNTER — Telehealth: Payer: Self-pay | Admitting: *Deleted

## 2018-08-03 MED ORDER — LEVOTHYROXINE SODIUM 125 MCG PO TABS: 125.0000 ug | ORAL_TABLET | Freq: Every day | ORAL | 4 refills | Status: DC

## 2018-08-03 MED ORDER — IBUPROFEN 800 MG PO TABS: 800.0000 mg | ORAL_TABLET | Freq: Three times a day (TID) | ORAL | 3 refills | Status: DC | PRN

## 2018-08-03 NOTE — Telephone Encounter (Signed)
Patient called requesting ibuprofen 800 mg and synthroid 125 mcg tablet sent to mail order pharmacy [facility]. I called and left message to speak with her about the vivelle dot patch, per note 07/03/18 she was to wean off patch.

## 2018-08-04 NOTE — Telephone Encounter (Signed)
Patient is still using vivelle-dot patches but only using half patch. She still has plenty left no Rx needed at this time.

## 2018-08-22 ENCOUNTER — Other Ambulatory Visit: Payer: Self-pay | Admitting: Gynecology

## 2018-11-20 ENCOUNTER — Other Ambulatory Visit: Payer: Self-pay

## 2018-11-20 ENCOUNTER — Ambulatory Visit (INDEPENDENT_AMBULATORY_CARE_PROVIDER_SITE_OTHER): Payer: 59 | Admitting: Obstetrics & Gynecology

## 2018-11-20 ENCOUNTER — Encounter: Payer: Self-pay | Admitting: Obstetrics & Gynecology

## 2018-11-20 VITALS — BP 116/70

## 2018-11-20 DIAGNOSIS — Z87448 Personal history of other diseases of urinary system: Secondary | ICD-10-CM | POA: Diagnosis not present

## 2018-11-20 DIAGNOSIS — R350 Frequency of micturition: Secondary | ICD-10-CM

## 2018-11-20 NOTE — Patient Instructions (Signed)
1. Frequent urination Frequent urination without any other symptoms of acute cystitis.  Urine analysis is just mildly perturbed.  Red blood cells 3-10 but history of microscopic hematuria followed by urology.  Recent urology work-up negative.  Decision to wait on urine culture before deciding on treatment.  Patient is already on doxycycline 50 mg twice a day for Rosacea. - Urinalysis,Complete w/RFL Culture  2. H/O hematuria Followed by urology with a recent negative work-up.  Other orders - doxycycline (VIBRAMYCIN) 50 MG capsule; Take 50 mg by mouth 2 (two) times daily.  Sheila Strickland, it was a pleasure meeting you today!  I will inform you of your results as soon as they are available.

## 2018-11-20 NOTE — Progress Notes (Signed)
    Sheila Strickland 1960-11-08 [id number]        58 y.o.  M3T5974   RP: Urinary frequency x a few days  HPI:  Urinary frequency without dysuria.  No suprapubic pain.  No blood seen in urine.  No fever. Followed by Urology for micro hematuria.  Last workup this year was negative.  On Doxy 100 mg daily for Rosacea.   OB History  Gravida Para Term Preterm AB Living  2 2 2     2   SAB TAB Ectopic Multiple Live Births               # Outcome Date GA Lbr Len/2nd Weight Sex Delivery Anes PTL Lv  2 Term           1 Term             Past medical history,surgical history, problem list, medications, allergies, family history and social history were all reviewed and documented in the EPIC chart.   Directed ROS with pertinent positives and negatives documented in the history of present illness/assessment and plan.  Exam:  Vitals:   11/20/18 1211  BP: 116/70   General appearance:  Normal  CVAT neg bilaterally  Gynecologic exam: deferred  U/A: Yellow clear, protein negative, nitrites negative, white blood cells 0-5, red blood cells 3-10, few bacteria.  Urine culture pending.   Assessment/Plan:  58 y.o. G2P2002   1. Frequent urination Frequent urination without any other symptoms of acute cystitis.  Urine analysis is just mildly perturbed.  Red blood cells 3-10 but history of microscopic hematuria followed by urology.  Recent urology work-up negative.  Decision to wait on urine culture before deciding on treatment.  Patient is already on doxycycline 50 mg twice a day for Rosacea. - Urinalysis,Complete w/RFL Culture  2. H/O hematuria Followed by urology with a recent negative work-up.  Other orders - doxycycline (VIBRAMYCIN) 50 MG capsule; Take 50 mg by mouth 2 (two) times daily.  Counseling on above issues and coordination of care more than 50% for 15 minutes.  Princess Bruins MD, 12:26 PM 11/20/2018

## 2018-11-22 LAB — URINALYSIS, COMPLETE W/RFL CULTURE
Bilirubin Urine: NEGATIVE
Glucose, UA: NEGATIVE
Hyaline Cast: NONE SEEN /LPF
Ketones, ur: NEGATIVE
Nitrites, Initial: NEGATIVE
Protein, ur: NEGATIVE
Specific Gravity, Urine: 1.025 (ref 1.001–1.03)
pH: 7 (ref 5.0–8.0)

## 2018-11-22 LAB — CULTURE INDICATED

## 2018-11-22 LAB — URINE CULTURE
MICRO NUMBER:: 574113
Result:: NO GROWTH
SPECIMEN QUALITY:: ADEQUATE

## 2019-03-06 ENCOUNTER — Encounter: Payer: Self-pay | Admitting: Gynecology

## 2019-06-29 IMAGING — US US ABDOMEN LIMITED
1 series · 14 of 25 positions shown · non-contrast
Comparison: CT scan 07/20/2011

CLINICAL DATA: Cholesterolosis of gallbladder

EXAM:
ULTRASOUND ABDOMEN LIMITED RIGHT UPPER QUADRANT

[Series 1: us abdomen limited · 0.20mm/px · 14 of 50 slices shown]
[im 1/50]
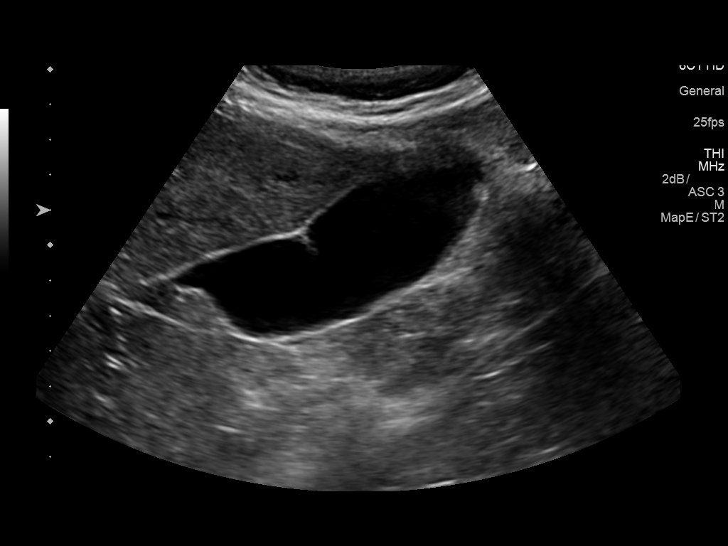
[im 5/50]
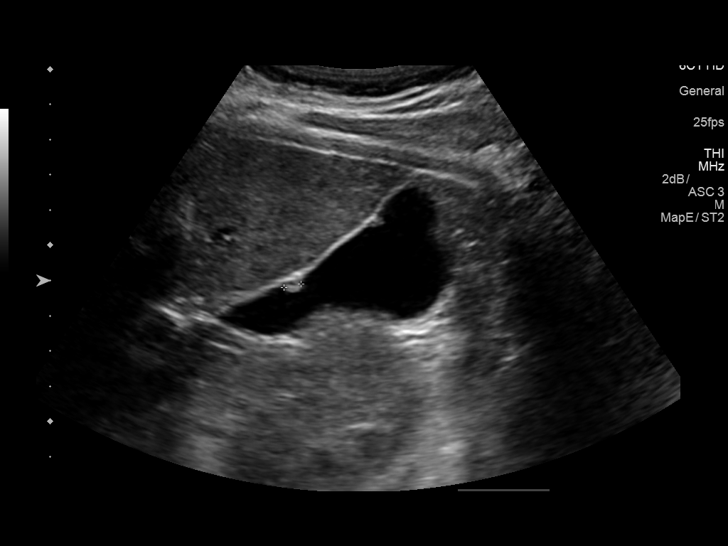
[im 9/50]
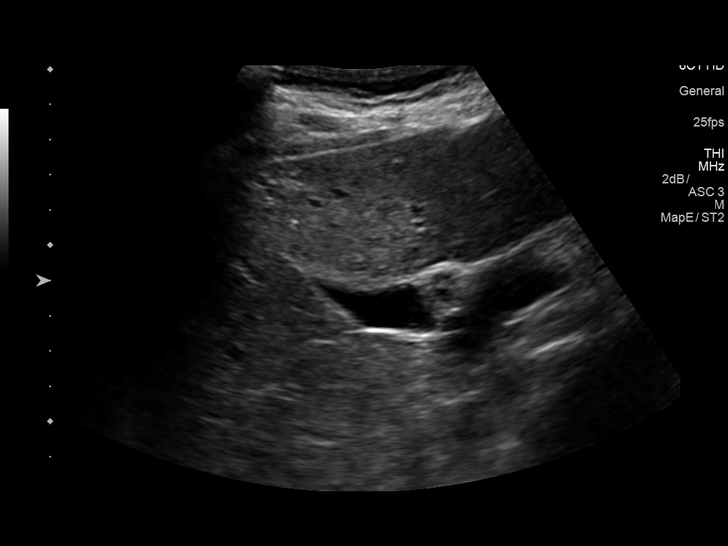
[im 13/50]
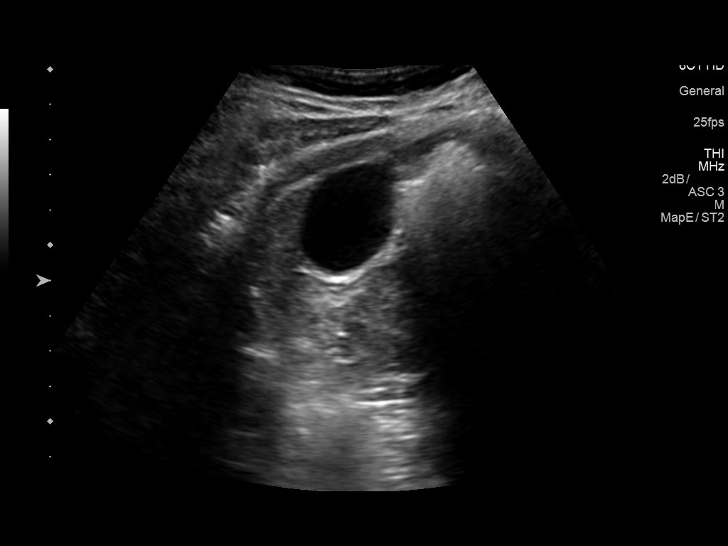
[im 17/50]
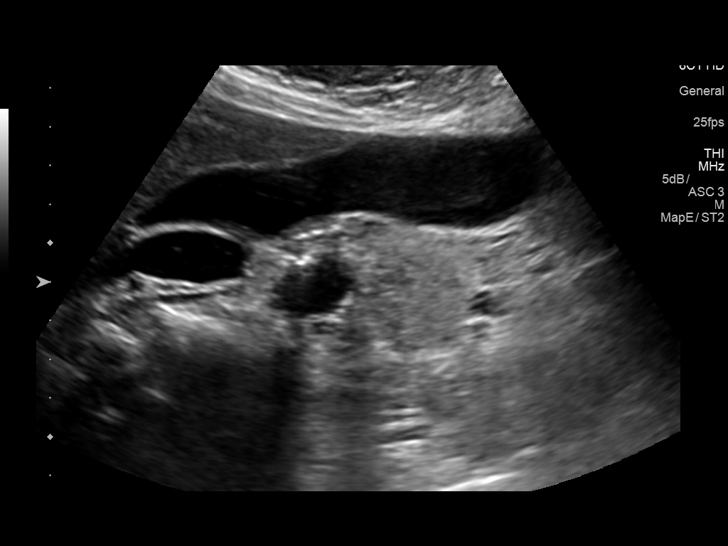
[im 19/50]
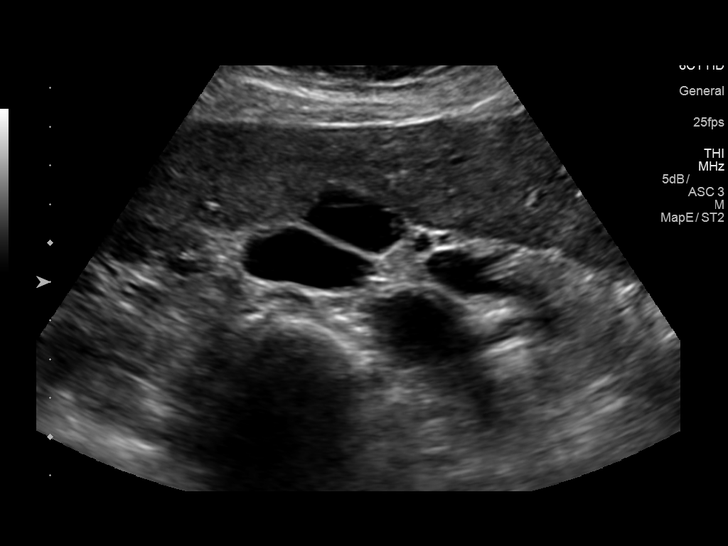
[im 23/50]
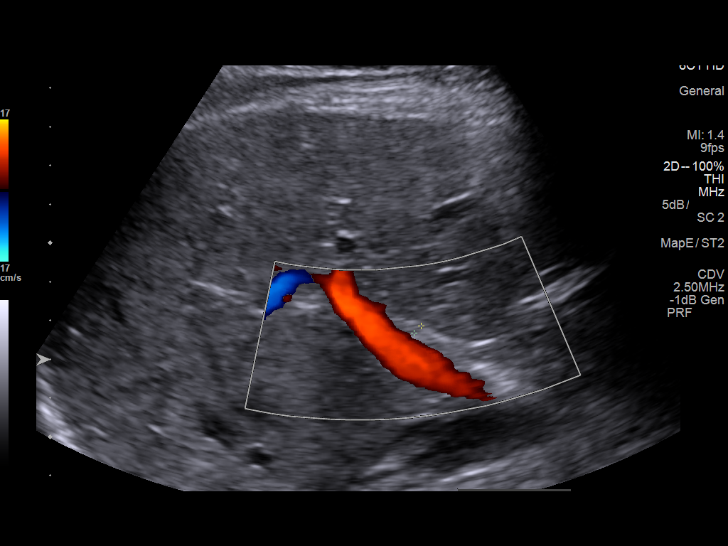
[im 27/50]
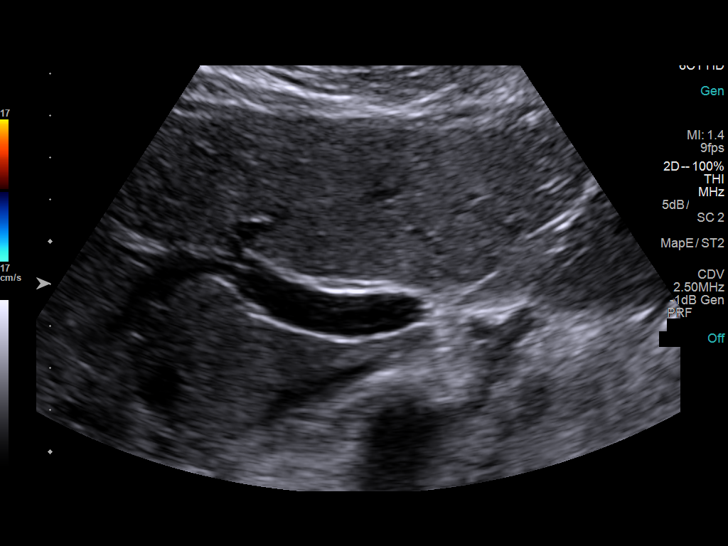
[im 31/50]
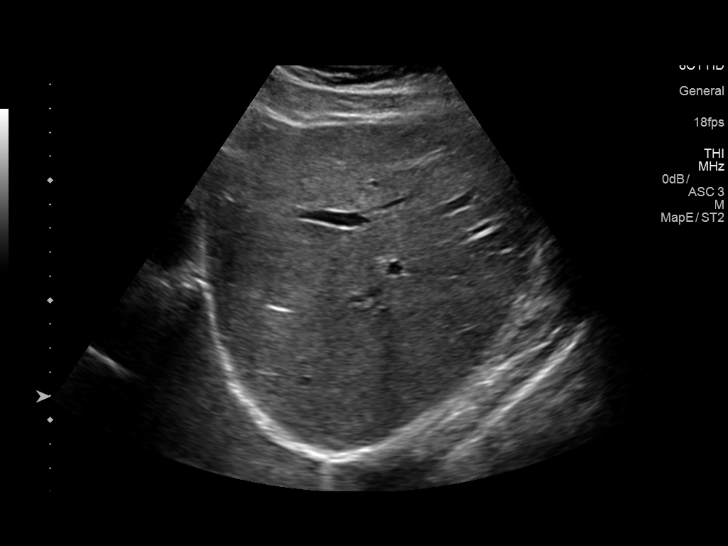
[im 33/50]
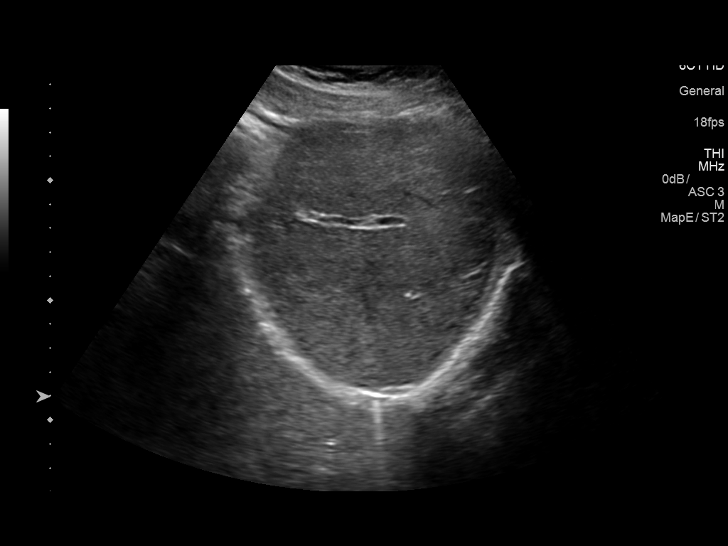
[im 37/50]
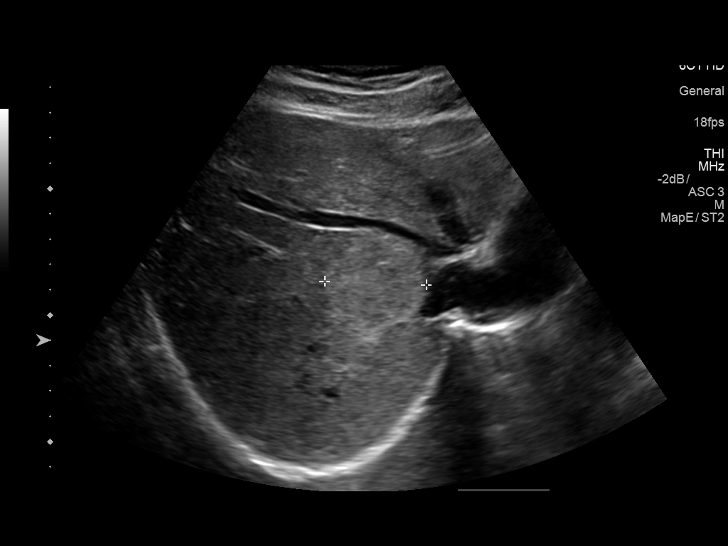
[im 41/50]
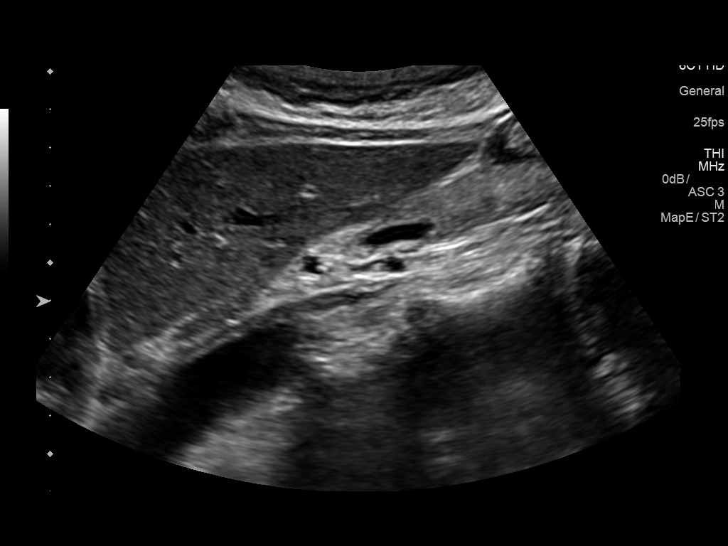
[im 45/50]
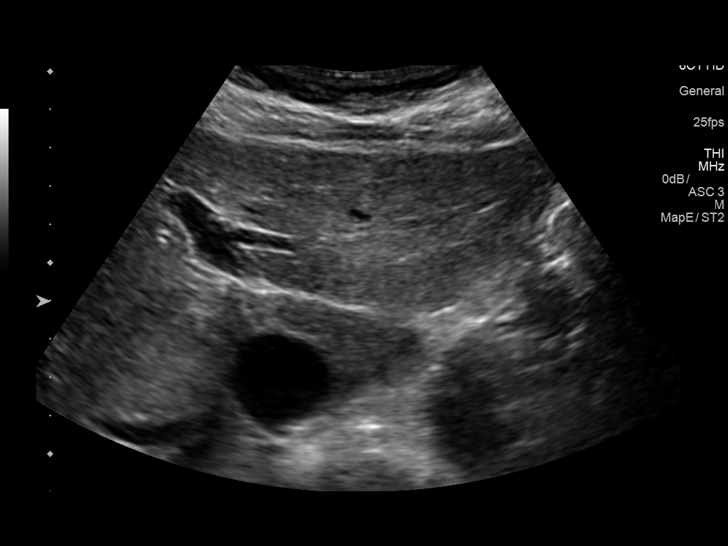
[im 50/50]
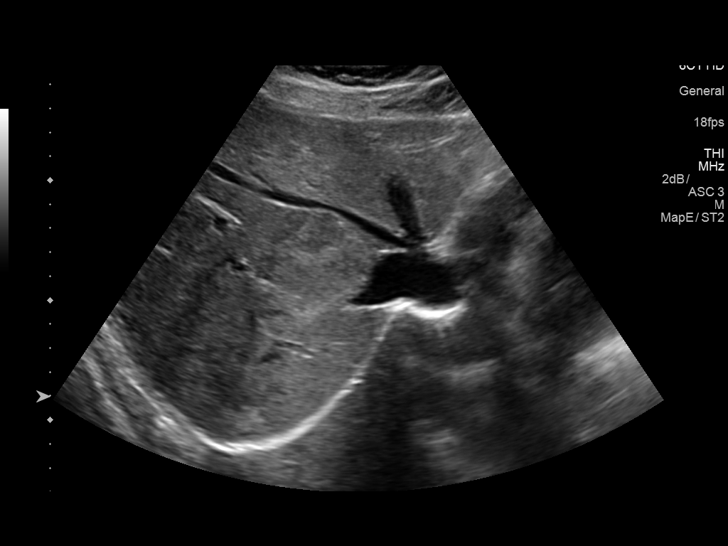

[14 of 25 positions shown; findings below may reference images not displayed]

FINDINGS: Gallbladder:

5 mm nonshadowing hyperechoic focus adherent to the mucosa of the
gallbladder wall is consistent with cholesterol polyp. No evidence
for stones. No gallbladder wall thickening or pericholecystic fluid.

Common bile duct:

Diameter: 2-3 mm.

Liver:

4 cm focal mildly hyperechoic lesion is identified in the central
liver adjacent to the IVC. This lesion was characterized on the CT
scan from 6 years ago as cavernous hemangioma. Portal vein is patent
on color Doppler imaging with normal direction of blood flow towards
the liver.
IMPRESSION: 1. 5 mm cholesterol polyp in the gallbladder.
2. 4 cm mildly hyperechoic liver lesion previously characterized as
benign cavernous hemangioma. The CT scan from 6 years ago
demonstrated other additional scattered hemangiomata in the liver
parenchyma.

## 2019-07-13 ENCOUNTER — Encounter: Payer: 59 | Admitting: Obstetrics and Gynecology

## 2019-07-27 ENCOUNTER — Other Ambulatory Visit: Payer: Self-pay

## 2019-07-27 ENCOUNTER — Ambulatory Visit (INDEPENDENT_AMBULATORY_CARE_PROVIDER_SITE_OTHER): Payer: 59 | Admitting: Obstetrics and Gynecology

## 2019-07-27 ENCOUNTER — Encounter: Payer: Self-pay | Admitting: Obstetrics and Gynecology

## 2019-07-27 VITALS — BP 122/80 | Ht 67.0 in | Wt 140.0 lb

## 2019-07-27 DIAGNOSIS — Z01419 Encounter for gynecological examination (general) (routine) without abnormal findings: Secondary | ICD-10-CM | POA: Diagnosis not present

## 2019-07-27 DIAGNOSIS — Z13220 Encounter for screening for lipoid disorders: Secondary | ICD-10-CM

## 2019-07-27 DIAGNOSIS — Z79890 Hormone replacement therapy (postmenopausal): Secondary | ICD-10-CM | POA: Diagnosis not present

## 2019-07-27 DIAGNOSIS — Z1321 Encounter for screening for nutritional disorder: Secondary | ICD-10-CM | POA: Diagnosis not present

## 2019-07-27 DIAGNOSIS — E038 Other specified hypothyroidism: Secondary | ICD-10-CM

## 2019-07-27 MED ORDER — LEVOTHYROXINE SODIUM 125 MCG PO TABS: 125.0000 ug | ORAL_TABLET | Freq: Every day | ORAL | 4 refills | Status: DC

## 2019-07-27 NOTE — Progress Notes (Signed)
Sheila Strickland August 28, 1960 [id number]  SUBJECTIVE:  59 y.o. G2P2002 female for annual routine gynecologic exam. She has no gynecologic concerns. She has weaned Vivelle patch down to 1/2 patch for 5-6 days at a time, but if goes longer than that she does start to notice the hot flashes.  Some pressure in rectal area, no bleeding in stool.  Current Outpatient Medications  Medication Sig Dispense Refill  . Cholecalciferol (VITAMIN D PO) Take 1 capsule by mouth as needed.     . clindamycin (CLINDAGEL) 1 % gel Apply 1 application topically daily.    Marland Kitchen doxycycline (VIBRAMYCIN) 50 MG capsule Take 50 mg by mouth 2 (two) times daily.    Marland Kitchen estradiol (VIVELLE-DOT) 0.05 MG/24HR patch PLACE 1 PATCH ONTO THE SKIN2 TIMES A WEEK (Patient taking differently: PLACE 1/2 PATCH ONTO THE SKIN2 TIMES A WEEK) 24 patch 3  . ibuprofen (ADVIL,MOTRIN) 800 MG tablet Take 1 tablet (800 mg total) by mouth every 8 (eight) hours as needed. 60 tablet 3  . L-LYSINE PO Take 1 capsule by mouth as needed. For cold sores    . levothyroxine (SYNTHROID) 125 MCG tablet Take 1 tablet (125 mcg total) by mouth daily. 90 tablet 4  . Multiple Vitamin (MULTIVITAMIN) tablet Take 1 tablet by mouth daily.    Marland Kitchen OVER THE COUNTER MEDICATION Take 1 tablet by mouth daily. Align probiotic    . Propylene Glycol (SYSTANE BALANCE OP) Apply 1 drop to eye as needed.     No current facility-administered medications for this visit.   Allergies: Patient has no known allergies.  Patient's last menstrual period was 12/13/2013.  Past medical history,surgical history, problem list, medications, allergies, family history and social history were all reviewed and documented as reviewed in the EPIC chart.  ROS:  Feeling well. No dyspnea or chest pain on exertion.  No abdominal pain, change in bowel habits, black or bloody stools.  No urinary tract symptoms. GYN ROS: no abnormal bleeding, pelvic pain or discharge, no breast pain or new or enlarging lumps on  self exam. No neurological complaints.   OBJECTIVE:  Ht 5\' 7"  (1.702 m)   Wt 140 lb (63.5 kg)   LMP 12/13/2013   BMI 21.93 kg/m  The patient appears well, alert, oriented x 3, in no distress. ENT normal.  Neck supple. No cervical or supraclavicular adenopathy or thyromegaly.  Lungs are clear, good air entry, no wheezes, rhonchi or rales. S1 and S2 normal, no murmurs, regular rate and rhythm.  Abdomen soft without tenderness, guarding, mass or organomegaly.  Neurological is normal, no focal findings.  BREAST EXAM: breasts appear normal, no suspicious masses, no skin or nipple changes or axillary nodes  PELVIC EXAM: VULVA: normal appearing vulva with no masses, tenderness or lesions, VAGINA: normal appearing vagina with normal color and discharge, no lesions, CERVIX: surgically absent, UTERUS: surgically absent, vaginal cuff well healed, ADNEXA: no masses, RECTAL: normal rectal, no masses, external hemorrhoids non-thrombosed  Chaperone: Caryn Bee present during the examination  ASSESSMENT:  59 y.o. [id number] here for annual gynecologic exam  PLAN:   1. Postmenopausal/HRT.  Previous LAVH BSO for leiomyoma and menorrhagia.  Weaning down on Vivelle patch to 1/2 of a 0.05 mg patch for 5-6 days per patch.  Encouraged her to continue trying to wean to 1/4 patch, if able to tolerate.  History of Leiden factor V heterologous with no personal history of thrombosis.  She understands the increased risk of thrombosis associated with Leiden factor V and use  of HRT, and also the increased risk of breast cancer with HRT use. 2. Pap smear 2019.  No Pap smear done today.  History of LEEP 2008 for CIN-1.  Normal Pap smears since.  Repeat in 2022. 3. Mammogram 07/2018, states she has repeat coming up soon.  Normal breast exam today. 4. Hypothyroidism.  Synthroid 125 mcg refilled. Check TSH today. 5. Hemorrhoids. Using topical treatment prn. No major abnormality on exam today. If worsening symptoms, may need  GI/general surgery referral. 6. Chronic headaches.  Ibuprofen 800 mg prn. No refill needed. 7. Colonoscopy 2013.  Recommended that she follow up at the recommended interval.   8. DEXA never, plan age 75.   69. Health maintenance.  Low normal vitamin D last year.  She will proceed to lab today for routine screening blood work (lipids, CBC, CMP, TSH, vitamin D level).  Return annually or sooner, prn.  Joseph Pierini MD  07/27/19

## 2019-07-27 NOTE — Patient Instructions (Signed)
Continue to try to wean off the estrogen patch, you can try 1/4 of a patch and see how you do on that, if not working you can go back to 1/2 of a patch at a time

## 2019-07-30 LAB — CBC
HCT: 43.9 % (ref 35.0–45.0)
Hemoglobin: 14.4 g/dL (ref 11.7–15.5)
MCH: 28.7 pg (ref 27.0–33.0)
MCHC: 32.8 g/dL (ref 32.0–36.0)
MCV: 87.6 fL (ref 80.0–100.0)
MPV: 12.2 fL (ref 7.5–12.5)
Platelets: 159 10*3/uL (ref 140–400)
RBC: 5.01 10*6/uL (ref 3.80–5.10)
RDW: 12 % (ref 11.0–15.0)
WBC: 4 10*3/uL (ref 3.8–10.8)

## 2019-07-30 LAB — COMPREHENSIVE METABOLIC PANEL
AG Ratio: 2.3 (calc) (ref 1.0–2.5)
ALT: 15 U/L (ref 6–29)
AST: 17 U/L (ref 10–35)
Albumin: 4.4 g/dL (ref 3.6–5.1)
Alkaline phosphatase (APISO): 44 U/L (ref 37–153)
BUN/Creatinine Ratio: 32 (calc) — ABNORMAL HIGH (ref 6–22)
BUN: 26 mg/dL — ABNORMAL HIGH (ref 7–25)
CO2: 27 mmol/L (ref 20–32)
Calcium: 9.2 mg/dL (ref 8.6–10.4)
Chloride: 103 mmol/L (ref 98–110)
Creat: 0.82 mg/dL (ref 0.50–1.05)
Globulin: 1.9 g/dL (calc) (ref 1.9–3.7)
Glucose, Bld: 82 mg/dL (ref 65–99)
Potassium: 4.1 mmol/L (ref 3.5–5.3)
Sodium: 139 mmol/L (ref 135–146)
Total Bilirubin: 0.7 mg/dL (ref 0.2–1.2)
Total Protein: 6.3 g/dL (ref 6.1–8.1)

## 2019-07-30 LAB — LIPID PANEL
Cholesterol: 186 mg/dL (ref <200)
HDL: 63 mg/dL (ref >=50)
LDL Cholesterol (Calc): 108 mg/dL (calc) — ABNORMAL HIGH
Non-HDL Cholesterol (Calc): 123 mg/dL (calc) (ref <130)
Total CHOL/HDL Ratio: 3 (calc) (ref <5.0)
Triglycerides: 66 mg/dL (ref <150)

## 2019-07-30 LAB — TSH: TSH: 1.66 mIU/L (ref 0.40–4.50)

## 2019-07-30 LAB — VITAMIN D 1,25 DIHYDROXY
Vitamin D 1, 25 (OH)2 Total: 52 pg/mL (ref 18–72)
Vitamin D2 1, 25 (OH)2: <8 pg/mL
Vitamin D3 1, 25 (OH)2: 52 pg/mL

## 2019-08-03 ENCOUNTER — Telehealth: Payer: Self-pay | Admitting: *Deleted

## 2019-08-03 DIAGNOSIS — Z01419 Encounter for gynecological examination (general) (routine) without abnormal findings: Secondary | ICD-10-CM

## 2019-08-03 NOTE — Telephone Encounter (Signed)
Patient was seen on 07/27/19 for annual exam and assumed her U/A was going to be checked. I explained to her we collect urine specimens on all patients and if she would like urine checked she would need to informed nurse or provider. Patient states she has history of  Microscopic hematuria, she is not having any UTI symptoms.  I placed order for U/A patient coming on 08/06/19 to leave specimen.

## 2019-08-06 ENCOUNTER — Ambulatory Visit: Payer: 59 | Attending: Internal Medicine

## 2019-08-06 ENCOUNTER — Other Ambulatory Visit: Payer: Self-pay

## 2019-08-06 ENCOUNTER — Other Ambulatory Visit: Payer: 59

## 2019-08-06 DIAGNOSIS — Z23 Encounter for immunization: Secondary | ICD-10-CM | POA: Insufficient documentation

## 2019-08-06 NOTE — Progress Notes (Signed)
   Covid-19 Vaccination Clinic  Name:  Sheila Strickland    MRN: [id number] DOB: [birth year]  08/06/2019  [name] was observed post Covid-19 immunization for 15 minutes without incidence. She was provided with Vaccine Information Sheet and instruction to access the V-Safe system.   [name] was instructed to call 911 with any severe reactions post vaccine: Marland Kitchen Difficulty breathing  . Swelling of your face and throat  . A fast heartbeat  . A bad rash all over your body  . Dizziness and weakness    Immunizations Administered    Name Date Dose VIS Date Route   Pfizer COVID-19 Vaccine 08/06/2019  8:46 AM 0.3 mL 05/18/2019 Intramuscular   Manufacturer: [place]   Lot: [id number]   [place]: [id number]

## 2019-08-20 ENCOUNTER — Other Ambulatory Visit: Payer: Self-pay

## 2019-08-20 MED ORDER — LEVOTHYROXINE SODIUM 125 MCG PO TABS: 125.0000 ug | ORAL_TABLET | Freq: Every day | ORAL | 4 refills | Status: DC

## 2019-09-05 ENCOUNTER — Ambulatory Visit: Payer: 59 | Attending: Internal Medicine

## 2019-09-05 DIAGNOSIS — Z23 Encounter for immunization: Secondary | ICD-10-CM

## 2019-09-05 NOTE — Progress Notes (Signed)
   Covid-19 Vaccination Clinic  Name:  Sheila Strickland    MRN: [id number] DOB: Oct 21, 1960  09/05/2019  Sheila Strickland was observed post Covid-19 immunization for 15 minutes without incident. She was provided with Vaccine Information Sheet and instruction to access the V-Safe system.   Sheila Strickland was instructed to call 911 with any severe reactions post vaccine: Marland Kitchen Difficulty breathing  . Swelling of face and throat  . A fast heartbeat  . A bad rash all over body  . Dizziness and weakness   Immunizations Administered    Name Date Dose VIS Date Route   Pfizer COVID-19 Vaccine 09/05/2019  8:36 AM 0.3 mL 05/18/2019 Intramuscular   Manufacturer: [place]   Lot: [id number]   [place]: [id number]

## 2019-10-01 DIAGNOSIS — S73192A Other sprain of left hip, initial encounter: Secondary | ICD-10-CM | POA: Insufficient documentation

## 2019-10-03 ENCOUNTER — Other Ambulatory Visit: Payer: Self-pay

## 2019-10-03 ENCOUNTER — Other Ambulatory Visit: Payer: 59

## 2019-10-03 ENCOUNTER — Telehealth: Payer: Self-pay | Admitting: *Deleted

## 2019-10-03 DIAGNOSIS — Z01419 Encounter for gynecological examination (general) (routine) without abnormal findings: Secondary | ICD-10-CM

## 2019-10-03 NOTE — Telephone Encounter (Signed)
Patient called to follow up from [place] on 07/27/19 currently using 1/4 estradiol patch twice weekly to wean off. However has noticed in the last couple of week increased hot flashes and night sweats. Asked can she increase, I re-read your note if 1/4 dose of patch doesn't work, may go to 1/2 estradiol patch twice weekly. Patient will start using 1/2 patch, she does have Leiden factor V and will be having hip surgery in July, just wanted to make sure you though this was okay?

## 2019-10-03 NOTE — Telephone Encounter (Signed)
Left detailed message on cell per DPR access, patient asked about urine results from Feb 2021 but the it appears the urine was never checked. Not having any symptoms, but does like to have this done as annual labs. Coming today at 4:30 pm for urine.

## 2019-10-03 NOTE — Telephone Encounter (Signed)
Yes, as long as she has a good understanding of the risks of estrogen use as we discussed, she can go back to the 1/2 estradiol patch as prescribed.  However, if she is having the hip surgery, I would highly encourage her to discontinue the estrogen use altogether the week before the surgery and throughout her rehabilitation period if she can tolerate it because this is going to greatly increase her risk of forming a blood clot while she is recovering from that.

## 2019-10-04 LAB — URINALYSIS, COMPLETE W/RFL CULTURE
Bacteria, UA: NONE SEEN /HPF
Bilirubin Urine: NEGATIVE
Glucose, UA: NEGATIVE
Hyaline Cast: NONE SEEN /LPF
Ketones, ur: NEGATIVE
Leukocyte Esterase: NEGATIVE
Nitrites, Initial: NEGATIVE
Protein, ur: NEGATIVE
RBC / HPF: NONE SEEN /HPF (ref 0–2)
Specific Gravity, Urine: 1.01 (ref 1.001–1.03)
Squamous Epithelial / HPF: NONE SEEN /HPF (ref <=5)
WBC, UA: NONE SEEN /HPF (ref 0–5)
pH: 5.5 (ref 5.0–8.0)

## 2019-10-04 LAB — NO CULTURE INDICATED

## 2019-12-20 DIAGNOSIS — Z96642 Presence of left artificial hip joint: Secondary | ICD-10-CM | POA: Insufficient documentation

## 2020-01-18 ENCOUNTER — Telehealth: Payer: Self-pay

## 2020-01-18 NOTE — Telephone Encounter (Signed)
Patient inquiring if there are any OTC products you would recommend she try for hotflashes to maybe get off the Est Patches?

## 2020-01-21 NOTE — Telephone Encounter (Signed)
black cohosh, or try 2 soy product servings daily or use soy isoflavone supplement, one example of a brand name supplement is [employer]

## 2020-01-21 NOTE — Telephone Encounter (Signed)
Spoke with patient and informed her. °

## 2020-02-20 ENCOUNTER — Telehealth: Payer: Self-pay | Admitting: *Deleted

## 2020-02-20 NOTE — Telephone Encounter (Signed)
Patient called requesting refill estradiol (vivelle-dot)patch 0.05 patch, reports she did stop the medication 3 months again due to a surgery. However she had a supply left over and restarted the patch. Had annual exam on 07/27/19 and discussed wean, patient is able to do that now,but does plan to wean in future. Asked if 1 year refill can be refilled at [facility]? Okay to send?

## 2020-02-21 NOTE — Telephone Encounter (Signed)
Yes ok to refill x 1 year I really would like her to try to start weaning off as soon as she can

## 2020-02-21 NOTE — Telephone Encounter (Signed)
Wanted to make sure you saw the below.

## 2020-02-22 MED ORDER — ESTRADIOL 0.05 MG/24HR TD PTTW: MEDICATED_PATCH | TRANSDERMAL | 3 refills | Status: DC

## 2020-02-22 NOTE — Telephone Encounter (Signed)
Rx sent patient informed  

## 2020-02-25 ENCOUNTER — Other Ambulatory Visit: Payer: Self-pay | Admitting: *Deleted

## 2020-02-25 ENCOUNTER — Telehealth: Payer: Self-pay | Admitting: *Deleted

## 2020-02-25 MED ORDER — FLUCONAZOLE 150 MG PO TABS
150.0000 mg | ORAL_TABLET | Freq: Once | ORAL | 0 refills | Status: AC
Start: 2020-02-25 — End: 2020-02-25

## 2020-02-25 MED ORDER — IBUPROFEN 800 MG PO TABS: 800.0000 mg | ORAL_TABLET | Freq: Three times a day (TID) | ORAL | 1 refills | Status: DC | PRN

## 2020-02-25 NOTE — Telephone Encounter (Signed)
Okay Diflucan 150 mg p.o. x1, needs to come into the office if symptoms persist or at that point would recommend trying OTC Monistat cream 7 night

## 2020-02-25 NOTE — Telephone Encounter (Signed)
Patient called c/o vaginal itching, slight white discharge, no odor, more itching than discharge. She asked if diflucan tablet can be sent to pharmacy. I did recommend she try OTC Monistat for relief, patient would prefer a pill. Please advise

## 2020-02-25 NOTE — Telephone Encounter (Signed)
Patient informed, Rx sent.  

## 2020-07-30 ENCOUNTER — Ambulatory Visit (INDEPENDENT_AMBULATORY_CARE_PROVIDER_SITE_OTHER): Payer: 59 | Admitting: Obstetrics and Gynecology

## 2020-07-30 ENCOUNTER — Encounter: Payer: Self-pay | Admitting: Obstetrics and Gynecology

## 2020-07-30 ENCOUNTER — Other Ambulatory Visit: Payer: Self-pay

## 2020-07-30 VITALS — BP 118/80 | Ht 67.0 in | Wt 146.0 lb

## 2020-07-30 DIAGNOSIS — Z01419 Encounter for gynecological examination (general) (routine) without abnormal findings: Secondary | ICD-10-CM

## 2020-07-30 DIAGNOSIS — E038 Other specified hypothyroidism: Secondary | ICD-10-CM

## 2020-07-30 DIAGNOSIS — Z79890 Hormone replacement therapy (postmenopausal): Secondary | ICD-10-CM | POA: Diagnosis not present

## 2020-07-30 DIAGNOSIS — Z1321 Encounter for screening for nutritional disorder: Secondary | ICD-10-CM

## 2020-07-30 DIAGNOSIS — Z1272 Encounter for screening for malignant neoplasm of vagina: Secondary | ICD-10-CM

## 2020-07-30 DIAGNOSIS — Z87448 Personal history of other diseases of urinary system: Secondary | ICD-10-CM

## 2020-07-30 DIAGNOSIS — Z13220 Encounter for screening for lipoid disorders: Secondary | ICD-10-CM | POA: Diagnosis not present

## 2020-07-30 DIAGNOSIS — Z87410 Personal history of cervical dysplasia: Secondary | ICD-10-CM

## 2020-07-30 LAB — CBC
HCT: 45.1 % — ABNORMAL HIGH (ref 35.0–45.0)
Hemoglobin: 14.8 g/dL (ref 11.7–15.5)
MCH: 29.2 pg (ref 27.0–33.0)
MCHC: 32.8 g/dL (ref 32.0–36.0)
MCV: 89.1 fL (ref 80.0–100.0)
MPV: 11.9 fL (ref 7.5–12.5)
Platelets: 151 10*3/uL (ref 140–400)
RBC: 5.06 10*6/uL (ref 3.80–5.10)
RDW: 12.9 % (ref 11.0–15.0)
WBC: 3.6 10*3/uL — ABNORMAL LOW (ref 3.8–10.8)

## 2020-07-30 LAB — TSH: TSH: 1.98 mIU/L (ref 0.40–4.50)

## 2020-07-30 LAB — COMPREHENSIVE METABOLIC PANEL
AG Ratio: 2 (calc) (ref 1.0–2.5)
ALT: 17 U/L (ref 6–29)
AST: 19 U/L (ref 10–35)
Albumin: 4.2 g/dL (ref 3.6–5.1)
Alkaline phosphatase (APISO): 51 U/L (ref 37–153)
BUN: 24 mg/dL (ref 7–25)
CO2: 31 mmol/L (ref 20–32)
Calcium: 9.1 mg/dL (ref 8.6–10.4)
Chloride: 101 mmol/L (ref 98–110)
Creat: 0.83 mg/dL (ref 0.50–1.05)
Globulin: 2.1 g/dL (calc) (ref 1.9–3.7)
Glucose, Bld: 81 mg/dL (ref 65–99)
Potassium: 4.1 mmol/L (ref 3.5–5.3)
Sodium: 138 mmol/L (ref 135–146)
Total Bilirubin: 0.6 mg/dL (ref 0.2–1.2)
Total Protein: 6.3 g/dL (ref 6.1–8.1)

## 2020-07-30 LAB — LIPID PANEL
Cholesterol: 208 mg/dL — ABNORMAL HIGH (ref <200)
HDL: 68 mg/dL (ref >=50)
LDL Cholesterol (Calc): 124 mg/dL (calc) — ABNORMAL HIGH
Non-HDL Cholesterol (Calc): 140 mg/dL (calc) — ABNORMAL HIGH (ref <130)
Total CHOL/HDL Ratio: 3.1 (calc) (ref <5.0)
Triglycerides: 65 mg/dL (ref <150)

## 2020-07-30 LAB — VITAMIN D 25 HYDROXY (VIT D DEFICIENCY, FRACTURES): Vit D, 25-Hydroxy: 47 ng/mL (ref 30–100)

## 2020-07-30 MED ORDER — ESTRADIOL 0.05 MG/24HR TD PTTW: MEDICATED_PATCH | TRANSDERMAL | 3 refills | Status: DC

## 2020-07-30 MED ORDER — LEVOTHYROXINE SODIUM 125 MCG PO TABS: 125.0000 ug | ORAL_TABLET | Freq: Every day | ORAL | 4 refills | Status: DC

## 2020-07-30 NOTE — Progress Notes (Signed)
Sheila Strickland May 04, 1961 [id number]  SUBJECTIVE:  60 y.o. G2P2002 female for annual routine gynecologic exam. She has no gynecologic concerns. She has weaned Vivelle patch down to 1/2 patch for now up to 7 days at a time.  Current Outpatient Medications  Medication Sig Dispense Refill  . Cholecalciferol (VITAMIN D PO) Take 1 capsule by mouth as needed.    . clindamycin (CLINDAGEL) 1 % gel Apply 1 application topically daily.    Marland Kitchen doxycycline (VIBRAMYCIN) 50 MG capsule Take 50 mg by mouth as needed.     Marland Kitchen estradiol (VIVELLE-DOT) 0.05 MG/24HR patch PLACE 1 PATCH ONTO THE SKIN2 TIMES A WEEK 24 patch 3  . ibuprofen (ADVIL) 800 MG tablet Take 1 tablet (800 mg total) by mouth every 8 (eight) hours as needed for mild pain. 60 tablet 1  . L-LYSINE PO Take 1 capsule by mouth as needed. For cold sores    . levothyroxine (SYNTHROID) 125 MCG tablet Take 1 tablet (125 mcg total) by mouth daily. 90 tablet 4  . Multiple Vitamin (MULTIVITAMIN) tablet Take 1 tablet by mouth daily.    Marland Kitchen OVER THE COUNTER MEDICATION Take 1 tablet by mouth daily. Align probiotic    . Propylene Glycol (SYSTANE BALANCE OP) Apply 1 drop to eye as needed.     No current facility-administered medications for this visit.   Allergies: Patient has no known allergies.  Patient's last menstrual period was 12/13/2013.  Past medical history,surgical history, problem list, medications, allergies, family history and social history were all reviewed and documented as reviewed in the EPIC chart.  ROS: Pertinent positives and negatives as reviewed in HPI   OBJECTIVE:  BP 118/80 (BP Location: Right Arm, Patient Position: Sitting, Cuff Size: Normal)   Ht 5\' 7"  (1.702 m)   Wt 146 lb (66.2 kg)   LMP 12/13/2013   BMI 22.87 kg/m  The patient appears well, alert, oriented x 3, in no distress. ENT normal.  Neck supple. No cervical or supraclavicular adenopathy or thyromegaly.  Lungs are clear, good air entry, no wheezes, rhonchi or  rales. S1 and S2 normal, no murmurs, regular rate and rhythm.  Abdomen soft without tenderness, guarding, mass or organomegaly.  Neurological is normal, no focal findings.  BREAST EXAM: breasts appear normal, no suspicious masses, no skin or nipple changes or axillary nodes  PELVIC EXAM: VULVA: normal appearing vulva with no masses, tenderness or lesions, VAGINA: normal appearing vagina with normal color and discharge, no lesions, CERVIX: surgically absent, UTERUS: surgically absent, vaginal cuff normal, ADNEXA: no masses, RECTAL: normal rectal, no masses, external hemorrhoids non-thrombosed  Chaperone: [name] Bonham present during the examination  ASSESSMENT:  60 y.o. Sheila Strickland here for annual gynecologic exam  PLAN:   1. Postmenopausal/HRT.  Previous LAVH BSO for leiomyoma and menorrhagia.  Has tried to further wean down on Vivelle patch to 1/2 of a 0.05 mg patch, now using it 1 week per patch.  Printed prescription provided for standard usage 2 patches per week.  Encouraged her to continue trying to wean to 1/4 patch, if able to tolerate.  History of Leiden factor V heterologous with no personal history of thrombosis.  She understands the increased risk of thrombosis associated with Leiden factor V and use of HRT, and also the increased risk of breast cancer with HRT use. 2. Pap smear 2019.    Pap smear of vaginal cuff repeated today.  History of LEEP 2008 for CIN-1.  Normal Pap smears since.  3. Mammogram 07/2019,  indicates she has repeat coming up soon.  Normal breast exam today. 4. Hypothyroidism.  Synthroid 125 mcg refilled, printed prescription provided. Check TSH today. 5. Hemorrhoids. Using topical treatment prn. No major abnormality on exam today.  6. Chronic headaches.  Ibuprofen 800 mg prn. No refill provided today. 7. Colonoscopy 2013.  Recommended that she follow up at the recommended interval.   8. Chronic hematuria.  She indicates she is followed by urology with intermittent  cystoscopy.  Low-grade hematuria on UA today is reviewed. 9. DEXA never, plan at age 4.   27. Health maintenance.  She will proceed to lab today for routine screening blood work (lipids, CBC, CMP, TSH, vitamin D level).  She will be looking into finding a primary care provider and we are happy to continue with her gynecologic checkups at this office.  The patient is aware that I will only be at this practice until early March 2022 so she knows to make sure she requests any needed follow-up when I am no longer at the practice.   Return annually or sooner, prn.  Joseph Pierini MD  07/30/20

## 2020-08-01 LAB — URINALYSIS, COMPLETE W/RFL CULTURE
Bacteria, UA: NONE SEEN /HPF
Bilirubin Urine: NEGATIVE
Glucose, UA: NEGATIVE
Hyaline Cast: NONE SEEN /LPF
Ketones, ur: NEGATIVE
Leukocyte Esterase: NEGATIVE
Nitrites, Initial: NEGATIVE
Protein, ur: NEGATIVE
Specific Gravity, Urine: 1.01 (ref 1.001–1.03)
WBC, UA: NONE SEEN /HPF (ref 0–5)
pH: 7 (ref 5.0–8.0)

## 2020-08-01 LAB — URINE CULTURE
MICRO NUMBER:: 11569384
Result:: NO GROWTH
SPECIMEN QUALITY:: ADEQUATE

## 2020-08-01 LAB — CULTURE INDICATED

## 2020-08-04 LAB — PAP IG W/ RFLX HPV ASCU

## 2021-07-06 ENCOUNTER — Telehealth: Payer: Self-pay | Admitting: *Deleted

## 2021-07-06 NOTE — Telephone Encounter (Signed)
Patient called requesting diflucan tablet, c/o yeast infection from taking antibiotic prescribed another provider. I explained to patient office visit are recommended for vaginal infection other options could be to try OTC monistat and no improvement to schedule office visit. Patient verbalized she understood.

## 2021-08-04 NOTE — Progress Notes (Signed)
61 y.o. G45P2002 Married Caucasian female here for annual exam.   ? ?Patient would like labs drawn today. ? ?Has chronic hematuria and usually has her urine checked here yearly.  ?Sees someone from [facility].  ?She had a negative work up.  ? ?She needs refill of Motrin 800 mg to treat migraines prn. ? ?PCP:  None  ? ?Patient's last menstrual period was 12/13/2013.     ?  ?    ?Sexually active: Yes.    ?The current method of family planning is status post hysterectomy/BSO.    ?Exercising: Yes.     Walking, biking, hiking ?Smoker:  no ? ?Health Maintenance: ?Pap:  07-30-20 Neg, 07-04-17 Neg, 06-06-12 Neg:Neg HR HPV ?History of abnormal Pap:  yes, Hx of LEEP 2008 for CIN I ?MMG:  08-08-20 Neg/BiRads1 ?Colonoscopy:  10-25-11 normal;10 years.  She will schedule with Dr. Henrene Pastor. ?BMD:   n/a  Result  n/a ?TDaP:  Unsure--would like today ?Gardasil:   n/a ?HIV:no ?Hep C:no ?Screening Labs:  today. ? ? reports that she has never smoked. She has never used smokeless tobacco. She reports current alcohol use of about 2.0 standard drinks per week. She reports that she does not use drugs. ? ?Past Medical History:  ?Diagnosis Date  ? Basal cell carcinoma   ? arm, thigh, hand  ? Benign hematuria   ? Factor V Leiden mutation ([place]) 12/2013  ? Heterologous  ? Hypothyroidism   ? Migraines   ? opc med prns  ? ? ?Past Surgical History:  ?Procedure Laterality Date  ? CERVICAL BIOPSY  W/ LOOP ELECTRODE EXCISION  2008  ? CIN-1  ? COLONOSCOPY    ? CYSTOSCOPY N/A 12/25/2013  ? Procedure: CYSTOSCOPY;  Surgeon: Anastasio Auerbach, MD  ? DILATION AND CURETTAGE OF UTERUS  02/17/2012  ? Procedure: DILATATION AND CURETTAGE;  Surgeon: Bennetta Laos, MD;  Location: [facility];  Service: Gynecology;  Laterality: N/A;  attempted with truclear  ? EYE SURGERY    ? Lasik eye - bilateral  ? HYSTEROSCOPY WITH D & C  02/17/2012  ? Procedure: DILATATION AND CURETTAGE /HYSTEROSCOPY;  Surgeon: Bennetta Laos, MD with polyp resection X 2 polyps  ? LAPAROSCOPIC  ASSISTED VAGINAL HYSTERECTOMY Bilateral 12/25/2013  ? Procedure: LAPAROSCOPIC ASSISTED VAGINAL HYSTERECTOMY WITH BILATERAL SALPINGO-OOPHORECTOMY;  Surgeon: Anastasio Auerbach, MD;  ? RHINOPLASTY    ? TOTAL HIP ARTHROPLASTY  2021  ? left  ? UPPER GASTROINTESTINAL ENDOSCOPY    ? ? ?Current Outpatient Medications  ?Medication Sig Dispense Refill  ? ASPIRIN CHILDRENS 81 MG chewable tablet     ? Cholecalciferol (VITAMIN D PO) Take 1 capsule by mouth as needed.    ? clindamycin (CLINDAGEL) 1 % gel Apply 1 application topically daily.    ? ibuprofen (ADVIL) 800 MG tablet Take 1 tablet (800 mg total) by mouth every 8 (eight) hours as needed for mild pain. 60 tablet 1  ? ibuprofen (ADVIL) 800 MG tablet IBU 800 mg tablet    ? L-LYSINE PO Take 1 capsule by mouth as needed. For cold sores    ? levothyroxine (SYNTHROID) 125 MCG tablet Take 1 tablet (125 mcg total) by mouth daily. 90 tablet 4  ? METROGEL 1 % gel Apply 1 application topically daily.    ? Multiple Vitamin (MULTIVITAMIN) tablet Take 1 tablet by mouth daily.    ? OVER THE COUNTER MEDICATION Take 1 tablet by mouth daily. Align probiotic    ? Oxymetazoline HCl (NASAL SPRAY) 0.05 %  SOLN Place into the nose.    ? Propylene Glycol (SYSTANE BALANCE OP) Apply 1 drop to eye as needed.    ? sodium chloride (OCEAN) 0.65 % nasal spray Place into the nose.    ? tretinoin (RETIN-A) 0.1 % cream Apply 1 application topically at bedtime.    ? ?No current facility-administered medications for this visit.  ? ? ?Family History  ?Problem Relation Age of Onset  ? Anuerysm Father   ?     berry brain anuerysm  ? Colon polyps Mother   ? Breast cancer Maternal Aunt 60  ? Cancer Brother   ?     colon  ? Colon cancer Neg Hx   ? Esophageal cancer Neg Hx   ? Rectal cancer Neg Hx   ? Stomach cancer Neg Hx   ? ? ?Review of Systems  ?All other systems reviewed and are negative. ? ?Exam:   ?BP 140/78   Pulse 66   Ht 5' 6.25" (1.683 m)   Wt 140 lb (63.5 kg)   LMP 12/13/2013   SpO2 99%   BMI  22.43 kg/m?     ?General appearance: alert, cooperative and appears stated age ?Head: normocephalic, without obvious abnormality, atraumatic ?Neck: no adenopathy, supple, symmetrical, trachea midline and thyroid normal to inspection and palpation ?Lungs: clear to auscultation bilaterally ?Breasts: normal appearance, no masses or tenderness, No nipple retraction or dimpling, No nipple discharge or bleeding, No axillary adenopathy ?Heart: regular rate and rhythm ?Abdomen: soft, non-tender; no masses, no organomegaly ?Extremities: extremities normal, atraumatic, no cyanosis or edema ?Skin: skin color, texture, turgor normal. No rashes or lesions ?Lymph nodes: cervical, supraclavicular, and axillary nodes normal. ?Neurologic: grossly normal ? ?Pelvic: External genitalia:  no lesions ?             No abnormal inguinal nodes palpated. ?             Urethra:  normal appearing urethra with no masses, tenderness or lesions ?             Bartholins and Skenes: normal    ?             Vagina: normal appearing vagina with normal color and discharge, no lesions ?             Cervix: absent ?             HR HPV with reflex pap collected. ?Bimanual Exam:  Uterus:absent ?             Adnexa: no mass, fullness, tenderness ?             Rectal exam: yes.  Confirms. ?             Anus:  normal sphincter tone, no lesions ? ?Chaperone was present for exam:  Estill Bamberg, CMA ? ?Assessment:   ?Well woman visit with gynecologic exam. ?Status post LAVH/BSO for fibroids.  ?Hx LEEP 2008.  CIN I. ?Off ERT.  I agree with staying off of estrogens. ?Factor V Leiden, heterozygous.  ?Hypothyroidism.  ?Elevated LDL cholesterol. ?Hx hematuria. Negative work up.  ? ?Plan: ?Mammogram screening discussed. ?Self breast awareness reviewed. ?HR HPV with reflex pap.  ?Guidelines for Calcium, Vitamin D, regular exercise program including cardiovascular and weight bearing exercise. ?Routine labs. ?Urinalysis and reflex culture.  ?TDap.  ?Rx for Motrin and  Synthroid. ?Information on PCPs through [facility]. ?Follow up annually and prn.  ? ?After visit summary provided.  ? ? ? ?

## 2021-08-05 ENCOUNTER — Encounter: Payer: Self-pay | Admitting: Obstetrics and Gynecology

## 2021-08-05 ENCOUNTER — Ambulatory Visit (INDEPENDENT_AMBULATORY_CARE_PROVIDER_SITE_OTHER): Payer: Managed Care, Other (non HMO) | Admitting: Obstetrics and Gynecology

## 2021-08-05 ENCOUNTER — Other Ambulatory Visit: Payer: Self-pay

## 2021-08-05 ENCOUNTER — Other Ambulatory Visit (HOSPITAL_COMMUNITY)
Admission: RE | Admit: 2021-08-05 | Discharge: 2021-08-05 | Disposition: A | Discharge status: Home / Self Care | Payer: 59 | Source: Ambulatory Visit | Attending: Obstetrics and Gynecology | Admitting: Obstetrics and Gynecology

## 2021-08-05 VITALS — BP 140/78 | HR 66 | Ht 66.25 in | Wt 140.0 lb

## 2021-08-05 DIAGNOSIS — R3129 Other microscopic hematuria: Secondary | ICD-10-CM | POA: Diagnosis not present

## 2021-08-05 DIAGNOSIS — Z23 Encounter for immunization: Secondary | ICD-10-CM | POA: Diagnosis not present

## 2021-08-05 DIAGNOSIS — Z01419 Encounter for gynecological examination (general) (routine) without abnormal findings: Secondary | ICD-10-CM

## 2021-08-05 DIAGNOSIS — E039 Hypothyroidism, unspecified: Secondary | ICD-10-CM

## 2021-08-05 DIAGNOSIS — Z0000 Encounter for general adult medical examination without abnormal findings: Secondary | ICD-10-CM

## 2021-08-05 DIAGNOSIS — E7800 Pure hypercholesterolemia, unspecified: Secondary | ICD-10-CM | POA: Diagnosis not present

## 2021-08-05 DIAGNOSIS — E038 Other specified hypothyroidism: Secondary | ICD-10-CM

## 2021-08-05 LAB — COMPREHENSIVE METABOLIC PANEL
AG Ratio: 2 (calc) (ref 1.0–2.5)
ALT: 13 U/L (ref 6–29)
AST: 20 U/L (ref 10–35)
Albumin: 4.6 g/dL (ref 3.6–5.1)
Alkaline phosphatase (APISO): 50 U/L (ref 37–153)
BUN: 20 mg/dL (ref 7–25)
CO2: 31 mmol/L (ref 20–32)
Calcium: 9.7 mg/dL (ref 8.6–10.4)
Chloride: 101 mmol/L (ref 98–110)
Creat: 0.83 mg/dL (ref 0.50–1.05)
Globulin: 2.3 g/dL (calc) (ref 1.9–3.7)
Glucose, Bld: 91 mg/dL (ref 65–99)
Potassium: 4.2 mmol/L (ref 3.5–5.3)
Sodium: 138 mmol/L (ref 135–146)
Total Bilirubin: 0.7 mg/dL (ref 0.2–1.2)
Total Protein: 6.9 g/dL (ref 6.1–8.1)

## 2021-08-05 LAB — URINALYSIS W MICROSCOPIC + REFLEX CULTURE
Bilirubin Urine: NEGATIVE
Glucose, UA: NEGATIVE
Hyaline Cast: NONE SEEN /LPF
Ketones, ur: NEGATIVE
Leukocyte Esterase: NEGATIVE
Nitrites, Initial: NEGATIVE
Protein, ur: NEGATIVE
Specific Gravity, Urine: 1.01 (ref 1.001–1.035)
WBC, UA: NONE SEEN /HPF (ref 0–5)
pH: 7 (ref 5.0–8.0)

## 2021-08-05 LAB — VITAMIN D 25 HYDROXY (VIT D DEFICIENCY, FRACTURES): Vit D, 25-Hydroxy: 44 ng/mL (ref 30–100)

## 2021-08-05 LAB — CBC
HCT: 45.8 % — ABNORMAL HIGH (ref 35.0–45.0)
Hemoglobin: 15 g/dL (ref 11.7–15.5)
MCH: 28.5 pg (ref 27.0–33.0)
MCHC: 32.8 g/dL (ref 32.0–36.0)
MCV: 86.9 fL (ref 80.0–100.0)
MPV: 12.3 fL (ref 7.5–12.5)
Platelets: 165 10*3/uL (ref 140–400)
RBC: 5.27 10*6/uL — ABNORMAL HIGH (ref 3.80–5.10)
RDW: 12.2 % (ref 11.0–15.0)
WBC: 4.1 10*3/uL (ref 3.8–10.8)

## 2021-08-05 LAB — LIPID PANEL
Cholesterol: 212 mg/dL — ABNORMAL HIGH (ref <200)
HDL: 73 mg/dL (ref >=50)
LDL Cholesterol (Calc): 123 mg/dL (calc) — ABNORMAL HIGH
Non-HDL Cholesterol (Calc): 139 mg/dL (calc) — ABNORMAL HIGH (ref <130)
Total CHOL/HDL Ratio: 2.9 (calc) (ref <5.0)
Triglycerides: 67 mg/dL (ref <150)

## 2021-08-05 LAB — TSH: TSH: 1.88 mIU/L (ref 0.40–4.50)

## 2021-08-05 LAB — NO CULTURE INDICATED

## 2021-08-05 LAB — T4, FREE: Free T4: 1.3 ng/dL (ref 0.8–1.8)

## 2021-08-05 MED ORDER — IBUPROFEN 800 MG PO TABS: 800.0000 mg | ORAL_TABLET | Freq: Three times a day (TID) | ORAL | 1 refills | Status: AC | PRN

## 2021-08-05 MED ORDER — LEVOTHYROXINE SODIUM 125 MCG PO TABS: 125.0000 ug | ORAL_TABLET | Freq: Every day | ORAL | 4 refills | Status: DC

## 2021-08-05 NOTE — Patient Instructions (Signed)

## 2021-08-12 LAB — CERVICOVAGINAL ANCILLARY ONLY
Comment: NEGATIVE
High risk HPV: NEGATIVE

## 2021-08-13 ENCOUNTER — Other Ambulatory Visit: Payer: Self-pay

## 2021-08-13 DIAGNOSIS — E038 Other specified hypothyroidism: Secondary | ICD-10-CM

## 2021-08-13 MED ORDER — LEVOTHYROXINE SODIUM 125 MCG PO TABS: 125.0000 ug | ORAL_TABLET | Freq: Every day | ORAL | 4 refills | Status: DC

## 2021-08-13 NOTE — Telephone Encounter (Signed)
You printed out pt's Rx at her AEX on 08/05/21. I called the pt to inform her of [facility]. She stated that she hadn't had the time to drop off the paper Rx to them and asked if we could just send electronically since she is almost out. Pt advised that should be fine. However, she should have the paper copy shredded. Pt voiced understanding.  ?

## 2021-08-24 ENCOUNTER — Encounter: Payer: Self-pay | Admitting: Obstetrics and Gynecology

## 2021-08-31 ENCOUNTER — Telehealth: Payer: Self-pay | Admitting: *Deleted

## 2021-08-31 NOTE — Telephone Encounter (Signed)
Patient called requesting name of GI MD to schedule her colonoscopy, it appears in chart patient saw Dr.Perry at [facility] [phone] given to patient to call and schedule. ?

## 2021-09-01 ENCOUNTER — Telehealth: Payer: Self-pay

## 2021-09-01 NOTE — Telephone Encounter (Signed)
Last AEX 08/05/21--mammo 08/21/21. ? ?Pt calling to report being off of estrogen patch for a couple months now and has experienced worsening hot flashes. Reports avg 8 a day. Pt does not specifically desires to return to patch but is inquiring if there are any other suggestions to help control sxs. Please advise.  ?

## 2021-09-01 NOTE — Telephone Encounter (Signed)
There are prescription medications such as Gabapentin, Paxil and Effexor which can treat hot flashes.  ? ?I would be happy to have her return for a visit to talk through options.  ? ?I do not recommend return to use of estrogen due to her Factor V Leiden positive status.  ?

## 2021-09-02 NOTE — Telephone Encounter (Signed)
Pt notified and voiced understanding. ?Pt reports she would like to avoid prescription if possible and has done some of her own research re: OTC relief. Has seen a couple called amberen and estroven and desires to know your opinions or success stories you may have heard from pt's re: these. Please advise.  ?

## 2021-09-02 NOTE — Telephone Encounter (Signed)
FYI. Left detailed msg per DPR.  

## 2021-09-02 NOTE — Telephone Encounter (Signed)
Patient may want to try Amberen.  ?It is completely nonhormonal.  ? ?Sheila Strickland has plant based estrogen in it.  ?

## 2021-09-02 NOTE — Telephone Encounter (Signed)
Encounter reviewed and closed.  

## 2021-09-09 ENCOUNTER — Encounter: Payer: Self-pay | Admitting: Internal Medicine

## 2021-10-19 ENCOUNTER — Ambulatory Visit (AMBULATORY_SURGERY_CENTER): Payer: Self-pay | Admitting: *Deleted

## 2021-10-19 VITALS — Ht 66.0 in | Wt 140.0 lb

## 2021-10-19 DIAGNOSIS — Z800 Family history of malignant neoplasm of digestive organs: Secondary | ICD-10-CM

## 2021-10-19 MED ORDER — NA SULFATE-K SULFATE-MG SULF 17.5-3.13-1.6 GM/177ML PO SOLN: 1.0000 | ORAL | 0 refills | Status: DC

## 2021-10-19 NOTE — Progress Notes (Signed)
Patient's pre-visit was done today over the phone with the patient. Name,DOB and address verified. Patient denies any allergies to Eggs and Soy. Patient denies any problems with anesthesia/sedation. Patient is not taking any diet pills or blood thinners. No home Oxygen. Insurance confirmed with patient. ? ?Prep instructions sent to pt's MyChart (if available) & mailed to pt-pt is aware. Patient understands to call us back with any questions or concerns. Patient is aware of our care-partner policy.  ? ?EMMI education assigned to the patient for the procedure, sent to [facility].  ? ?The patient is COVID-19 vaccinated.   ?

## 2021-11-06 ENCOUNTER — Encounter: Payer: Self-pay | Admitting: Internal Medicine

## 2021-11-09 ENCOUNTER — Ambulatory Visit (AMBULATORY_SURGERY_CENTER): Payer: Commercial Managed Care - HMO | Admitting: Internal Medicine

## 2021-11-09 ENCOUNTER — Encounter: Payer: Self-pay | Admitting: Internal Medicine

## 2021-11-09 VITALS — BP 127/78 | HR 64 | Temp 97.5°F | Resp 12 | Ht 66.0 in | Wt 140.0 lb

## 2021-11-09 DIAGNOSIS — Z1211 Encounter for screening for malignant neoplasm of colon: Secondary | ICD-10-CM

## 2021-11-09 MED ORDER — SODIUM CHLORIDE 0.9 % IV SOLN: 500.0000 mL | Freq: Once | INTRAVENOUS | Status: DC

## 2021-11-09 NOTE — Progress Notes (Signed)
A and O x3. Report to RN. Tolerated MAC anesthesia well. 

## 2021-11-09 NOTE — Patient Instructions (Signed)
Discharge instructions given. Handouts on Diverticulosis. Resume previous medications. YOU HAD AN ENDOSCOPIC PROCEDURE TODAY AT [facility]:   Refer to the procedure report that was given to you for any specific questions about what was found during the examination.  If the procedure report does not answer your questions, please call your gastroenterologist to clarify.  If you requested that your care partner not be given the details of your procedure findings, then the procedure report has been included in a sealed envelope for you to review at your convenience later.  YOU SHOULD EXPECT: Some feelings of bloating in the abdomen. Passage of more gas than usual.  Walking can help get rid of the air that was put into your GI tract during the procedure and reduce the bloating. If you had a lower endoscopy (such as a colonoscopy or flexible sigmoidoscopy) you may notice spotting of blood in your stool or on the toilet paper. If you underwent a bowel prep for your procedure, you may not have a normal bowel movement for a few days.  Please Note:  You might notice some irritation and congestion in your nose or some drainage.  This is from the oxygen used during your procedure.  There is no need for concern and it should clear up in a day or so.  SYMPTOMS TO REPORT IMMEDIATELY:  Following lower endoscopy (colonoscopy or flexible sigmoidoscopy):  Excessive amounts of blood in the stool  Significant tenderness or worsening of abdominal pains  Swelling of the abdomen that is new, acute  Fever of 100F or higher   For urgent or emergent issues, a gastroenterologist can be reached at any hour by calling [phone]. Do not use MyChart messaging for urgent concerns.    DIET:  We do recommend a small meal at first, but then you may proceed to your regular diet.  Drink plenty of fluids but you should avoid alcoholic beverages for 24 hours.  ACTIVITY:  You should plan to take it easy for  the rest of today and you should NOT DRIVE or use heavy machinery until tomorrow (because of the sedation medicines used during the test).    FOLLOW UP: Our staff will call the number listed on your records 24-72 hours following your procedure to check on you and address any questions or concerns that you may have regarding the information given to you following your procedure. If we do not reach you, we will leave a message.  We will attempt to reach you two times.  During this call, we will ask if you have developed any symptoms of COVID 19. If you develop any symptoms (ie: fever, flu-like symptoms, shortness of breath, cough etc.) before then, please call [phone].  If you test positive for Covid 19 in the 2 weeks post procedure, please call and report this information to [place].    If any biopsies were taken you will be contacted by phone or by letter within the next 1-3 weeks.  Please call us at [phone] if you have not heard about the biopsies in 3 weeks.    SIGNATURES/CONFIDENTIALITY: You and/or your care partner have signed paperwork which will be entered into your electronic medical record.  These signatures attest to the fact that that the information above on your After Visit Summary has been reviewed and is understood.  Full responsibility of the confidentiality of this discharge information lies with you and/or your care-partner.

## 2021-11-09 NOTE — Progress Notes (Signed)
Pt's states no medical or surgical changes since previsit or office visit. 

## 2021-11-09 NOTE — Op Note (Signed)
[facility] Patient Name: Sheila Strickland Procedure Date: 11/09/2021 1:30 PM MRN: [id number] Endoscopist: Docia Chuck. Henrene Pastor , MD Age: 61 Referring MD:  Date of Birth: [birth year] Gender: Female Account #: [id number] Procedure:                Colonoscopy Indications:              Screening for colorectal malignant neoplasm.                            Previous examination May 2013 was normal Medicines:                Monitored Anesthesia Care Procedure:                Pre-Anesthesia Assessment:                           - Prior to the procedure, a History and Physical                            was performed, and patient medications and                            allergies were reviewed. The patient's tolerance of                            previous anesthesia was also reviewed. The risks                            and benefits of the procedure and the sedation                            options and risks were discussed with the patient.                            All questions were answered, and informed consent                            was obtained. Prior Anticoagulants: The patient has                            taken no previous anticoagulant or antiplatelet                            agents. ASA Grade Assessment: II - A patient with                            mild systemic disease. After reviewing the risks                            and benefits, the patient was deemed in                            satisfactory condition to undergo the procedure.  After obtaining informed consent, the colonoscope                            was passed under direct vision. Throughout the                            procedure, the patient's blood pressure, pulse, and                            oxygen saturations were monitored continuously. The                            CF [id number] #[id number] was introduced through the anus                            and advanced to the the  cecum, identified by                            appendiceal orifice and ileocecal valve. The                            ileocecal valve, appendiceal orifice, and rectum                            were photographed. The quality of the bowel                            preparation was excellent. The colonoscopy was                            performed without difficulty. The patient tolerated                            the procedure well. The bowel preparation used was                            SUPREP via split dose instruction. Scope In: 1:42:07 PM Scope Out: 2:01:40 PM Scope Withdrawal Time: 0 hours 11 minutes 48 seconds  Total Procedure Duration: 0 hours 19 minutes 33 seconds  Findings:                 A few small-mouthed diverticula were found in the                            sigmoid colon. Small internal hemorrhoids noted.                           The exam was otherwise without abnormality on                            direct and retroflexion views. Complications:            No immediate complications. Estimated blood loss:  None. Estimated Blood Loss:     Estimated blood loss: none. Impression:               - Diverticulosis in the sigmoid colon.                           - The examination was otherwise normal on direct                            and retroflexion views.                           - No specimens collected. Recommendation:           - Repeat colonoscopy in 10 years for screening                            purposes.                           - Patient has a contact number available for                            emergencies. The signs and symptoms of potential                            delayed complications were discussed with the                            patient. Return to normal activities tomorrow.                            Written discharge instructions were provided to the                            patient.                            - Resume previous diet.                           - Continue present medications. Docia Chuck. Henrene Pastor, MD 11/09/2021 2:10:32 PM This report has been signed electronically.

## 2021-11-09 NOTE — Progress Notes (Signed)
HISTORY OF PRESENT ILLNESS:  Sheila Strickland is a 61 y.o. female who presents today for screening colonoscopy.  Index examination May 2013 was normal.  No active complaints  REVIEW OF SYSTEMS:  All non-GI ROS negative. Past Medical History:  Diagnosis Date   Basal cell carcinoma    arm, thigh, hand   Benign hematuria    Factor V Leiden mutation ([place]) 12/2013   Heterologous   Hypothyroidism    Migraines    opc med prns    Past Surgical History:  Procedure Laterality Date   CERVICAL BIOPSY  W/ LOOP ELECTRODE EXCISION  2008   CIN-1   COLONOSCOPY  10/2011   Dr.Emmette Katt   CYSTOSCOPY N/A 12/25/2013   Procedure: CYSTOSCOPY;  Surgeon: Anastasio Auerbach, MD   DILATION AND CURETTAGE OF UTERUS  02/17/2012   Procedure: DILATATION AND CURETTAGE;  Surgeon: Bennetta Laos, MD;  Location: [facility];  Service: Gynecology;  Laterality: N/A;  attempted with truclear   EYE SURGERY     Lasik eye - bilateral   HYSTEROSCOPY WITH D & C  02/17/2012   Procedure: DILATATION AND CURETTAGE /HYSTEROSCOPY;  Surgeon: Bennetta Laos, MD with polyp resection X 2 polyps   LAPAROSCOPIC ASSISTED VAGINAL HYSTERECTOMY Bilateral 12/25/2013   Procedure: LAPAROSCOPIC ASSISTED VAGINAL HYSTERECTOMY WITH BILATERAL SALPINGO-OOPHORECTOMY;  Surgeon: Anastasio Auerbach, MD;   RHINOPLASTY     TOTAL HIP ARTHROPLASTY  2021   left   UPPER GASTROINTESTINAL ENDOSCOPY      Social History Sheila Strickland  reports that she has never smoked. She has never used smokeless tobacco. She reports current alcohol use of about 2.0 standard drinks per week. She reports that she does not use drugs.  family history includes Anuerysm in her father; Breast cancer (age of onset: 80) in her maternal aunt; Cancer in her brother; Colon cancer (age of onset: 57) in her brother; Colon polyps in her mother.  Allergies  Allergen Reactions   Gramineae Pollens Itching   Doxycycline Itching and Rash    Hives        PHYSICAL  EXAMINATION:  Vital signs: BP (!) 159/94   Pulse 84   Temp (!) 97.5 F (36.4 C)   Resp 13   Ht '5\' 6"'$  (1.676 m)   Wt 140 lb (63.5 kg)   LMP 12/13/2013   SpO2 99%   BMI 22.60 kg/m  General: Well-developed, well-nourished, no acute distress HEENT: Sclerae are anicteric, conjunctiva pink. Oral mucosa intact Lungs: Clear Heart: Regular Abdomen: soft, nontender, nondistended, no obvious ascites, no peritoneal signs, normal bowel sounds. No organomegaly. Extremities: No edema Psychiatric: alert and oriented x3. Cooperative     ASSESSMENT:  Colon cancer screening   PLAN:  Screening colonoscopy

## 2021-11-10 ENCOUNTER — Telehealth: Payer: Self-pay | Admitting: *Deleted

## 2021-11-10 NOTE — Telephone Encounter (Signed)
No answer on first attempt follow up call. Left message.  ?

## 2021-11-10 NOTE — Telephone Encounter (Signed)
  Follow up Call-     11/09/2021    1:14 PM  Call back number  Post procedure Call Back phone  # [phone]  Permission to leave phone message Yes     Patient questions:  Do you have a fever, pain , or abdominal swelling? No. Pain Score  0 *  Have you tolerated food without any problems? Yes.    Have you been able to return to your normal activities? Yes.    Do you have any questions about your discharge instructions: Diet   No. Medications  No. Follow up visit  No.  Do you have questions or concerns about your Care? No.  Actions: * If pain score is 4 or above: No action needed, pain <4.

## 2022-05-11 ENCOUNTER — Encounter: Payer: Self-pay | Admitting: Obstetrics and Gynecology

## 2022-05-11 NOTE — Progress Notes (Unsigned)
GYNECOLOGY  VISIT   HPI: 61 y.o.   Married  Caucasian  female   G2P2002 with Patient's last menstrual period was 12/13/2013.   here for dyspareunia, not quite sure exactly the area that's the most painful. Denies bleeding. Does use lubrication and still experiences pain.     Having some insertional dyspareunia.  Notes a more firm area just inside the vaginal opening.   No bleeding.   Using [employer].    No vaginal itching, odor, or irritation.  Heterozygous for factor V Leiden.   Had elevated blood pressure with Effexor, which she took for migraines.   GYNECOLOGIC HISTORY: Patient's last menstrual period was 12/13/2013. Contraception: Hyst Menopausal hormone therapy: none Last mammogram: 08/21/2021-neg birads 1 Last pap smear: 07-30-20 Neg, 07-04-17 Neg, 06-06-12 Neg:Neg HR HPV         OB History     Gravida  2   Para  2   Term  2   Preterm      AB  0   Living  2      SAB      IAB      Ectopic  0   Multiple      Live Births                 Patient Active Problem List   Diagnosis Date Noted   Migraines    Thyroid disease    ABNORMAL EXAM-BILIARY TRACT 03/03/2009    Past Medical History:  Diagnosis Date   Basal cell carcinoma    arm, thigh, hand   Benign hematuria    Factor V Leiden mutation ([place]) 12/2013   Heterologous   Hypothyroidism    Migraines    opc med prns    Past Surgical History:  Procedure Laterality Date   CERVICAL BIOPSY  W/ LOOP ELECTRODE EXCISION  2008   CIN-1   COLONOSCOPY  10/2011   Dr.Perry   CYSTOSCOPY N/A 12/25/2013   Procedure: CYSTOSCOPY;  Surgeon: Anastasio Auerbach, MD   DILATION AND CURETTAGE OF UTERUS  02/17/2012   Procedure: DILATATION AND CURETTAGE;  Surgeon: Bennetta Laos, MD;  Location: [facility];  Service: Gynecology;  Laterality: N/A;  attempted with truclear   EYE SURGERY     Lasik eye - bilateral   HYSTEROSCOPY WITH D & C  02/17/2012   Procedure: DILATATION AND CURETTAGE /HYSTEROSCOPY;  Surgeon:  Bennetta Laos, MD with polyp resection X 2 polyps   LAPAROSCOPIC ASSISTED VAGINAL HYSTERECTOMY Bilateral 12/25/2013   Procedure: LAPAROSCOPIC ASSISTED VAGINAL HYSTERECTOMY WITH BILATERAL SALPINGO-OOPHORECTOMY;  Surgeon: Anastasio Auerbach, MD;   RHINOPLASTY     TOTAL HIP ARTHROPLASTY  2021   left   UPPER GASTROINTESTINAL ENDOSCOPY      Current Outpatient Medications  Medication Sig Dispense Refill   Ascorbic Acid (VITAMIN C) 1000 MG tablet Take 1,000 mg by mouth daily. +'250mg'$  root     Cholecalciferol (VITAMIN D PO) Take 1 capsule by mouth as needed.     ibuprofen (ADVIL) 800 MG tablet Take 1 tablet (800 mg total) by mouth every 8 (eight) hours as needed for mild pain. 60 tablet 1   L-LYSINE PO Take 1 capsule by mouth as needed. For cold sores     levothyroxine (SYNTHROID) 125 MCG tablet Take 1 tablet (125 mcg total) by mouth daily. 90 tablet 4   METROGEL 1 % gel Apply 1 application topically daily.     Multiple Vitamin (MULTIVITAMIN) tablet Take 1 tablet by mouth daily.  OVER THE COUNTER MEDICATION Take 1 tablet by mouth daily. Align probiotic     Propylene Glycol (SYSTANE BALANCE OP) Apply 1 drop to eye as needed.     sodium chloride (OCEAN) 0.65 % nasal spray Place into the nose.     tretinoin (RETIN-A) 0.1 % cream Apply 1 application topically at bedtime.     No current facility-administered medications for this visit.     ALLERGIES: Gramineae pollens and Doxycycline  Family History  Problem Relation Age of Onset   Colon polyps Mother    Anuerysm Father        berry brain anuerysm   Colon cancer Brother 61   Cancer Brother        colon   Breast cancer Maternal Aunt 71   Esophageal cancer Neg Hx    Rectal cancer Neg Hx    Stomach cancer Neg Hx     Social History   Socioeconomic History   Marital status: Married    Spouse name: Not on file   Number of children: 2   Years of education: Not on file   Highest education level: Not on file  Occupational History    Occupation: Therapist, sports    Employer: [employer]  Tobacco Use   Smoking status: Never   Smokeless tobacco: Never  Vaping Use   Vaping Use: Never used  Substance and Sexual Activity   Alcohol use: Yes    Alcohol/week: 2.0 standard drinks of alcohol    Types: 2 Standard drinks or equivalent per week    Comment: weekends   Drug use: No   Sexual activity: Yes    Birth control/protection: None, Surgical    Comment: VAS/Hyst--1st intercourse 17 yo--5 partners  Other Topics Concern   Not on file  Social History Narrative   Not on file   Social Determinants of Health   Financial Resource Strain: Not on file  Food Insecurity: Not on file  Transportation Needs: Not on file  Physical Activity: Not on file  Stress: Not on file  Social Connections: Not on file  Intimate Partner Violence: Not on file    Review of Systems  Genitourinary:  Positive for dyspareunia.  All other systems reviewed and are negative.   PHYSICAL EXAMINATION:    BP 98/72   Pulse (!) 104   LMP 12/13/2013   SpO2 97%     General appearance: alert, cooperative and appears stated age   Pelvic: External genitalia:  no lesions              Urethra:  normal appearing urethra with no masses, tenderness or lesions              Bartholins and Skenes: normal                 Vagina: normal appearing vagina with normal color and discharge, no lesions.  Posterior vaginal wall with scar consistent with prior potential episiotomy.  No lesions noted.               Cervix: no lesions                Bimanual Exam:  Uterus:  normal size, contour, position, consistency, mobility, non-tender              Adnexa: no mass, fullness, tenderness              Rectal exam: yes.  Confirms.  Anus:  normal sphincter tone, no lesions  Chaperone was present for exam:  Lovena Le  ASSESSMENT  Dyspareunia.  Vaginal atrophy.  Factor V Leiden, heterozygous status.   PLAN  We discussed vaginal atrophy and treatment options for  vitamin E, vaginal lubricants, and cooking oils.  She will let me know if she would like prescription strength vit E suppositories.  She is not a candidate for vaginal estrogen treatments.  Fu for annual exam and prn.    An After Visit Summary was printed and given to the patient.  21 min  total time was spent for this patient encounter, including preparation, face-to-face counseling with the patient, coordination of care, and documentation of the encounter.

## 2022-05-14 ENCOUNTER — Ambulatory Visit: Payer: Commercial Managed Care - HMO | Admitting: Obstetrics and Gynecology

## 2022-05-14 ENCOUNTER — Encounter: Payer: Self-pay | Admitting: Obstetrics and Gynecology

## 2022-05-14 VITALS — BP 98/72 | HR 104

## 2022-05-14 DIAGNOSIS — N952 Postmenopausal atrophic vaginitis: Secondary | ICD-10-CM | POA: Diagnosis not present

## 2022-05-14 NOTE — Patient Instructions (Addendum)
You may buy over the counter vitamin E suppositories or liquid vitamin E for the vagina on [employer].   The Good Clean Love products are also used for vaginal hydration.   You may use coconut oil, olive oil, or canola oil for hydration and sex as well.   Atrophic Vaginitis  Atrophic vaginitis is a condition in which the tissues that line the vagina become dry and thin. This condition is most common in women who have stopped having regular menstrual periods (are in menopause). This usually starts when a woman is 74 to 61 years old. That is the time when a woman's estrogen levels begin to decrease. Estrogen is a female hormone. It helps to keep the tissues of the vagina moist. It stimulates the vagina to produce a clear fluid that lubricates the vagina for sex. This fluid also protects the vagina from infection. Lack of estrogen can cause the lining of the vagina to get thinner and dryer. The vagina may also shrink in size. It may become less elastic. Atrophic vaginitis tends to get worse over time as a woman's estrogen level drops. What are the causes? This condition is caused by the normal drop in estrogen that happens around the time of menopause. What increases the risk? Certain conditions or situations may lower a woman's estrogen level, leading to a higher risk for atrophic vaginitis. You are more likely to develop this condition if: You are taking medicines that block estrogen. You have had your ovaries removed. You are being treated for cancer with radiation or medicines (chemotherapy). You have given birth or are breastfeeding. You are older than age 61. You smoke. What are the signs or symptoms? Symptoms of this condition include: Pain, soreness, a feeling of pressure, or bleeding during sex (dyspareunia). Vaginal burning, irritation, or itching. Pain or bleeding when a speculum is used in a vaginal exam. Having burning pain while urinating. Vaginal discharge. In some cases, there are  no symptoms. How is this diagnosed? This condition is diagnosed based on your medical history and a physical exam. This will include a pelvic exam that checks the vaginal tissues. Though rare, you may also have other tests, including: A urine test. A test that checks the acid balance in your vagina (acid balance test). How is this treated? Treatment for this condition depends on how severe your symptoms are. Treatment may include: Using an over-the-counter vaginal lubricant before sex. Using a long-acting vaginal moisturizer. Using low-dose estrogen for moderate to severe symptoms that do not respond to other treatments. Options include creams, tablets, and inserts (vaginal rings). Before you use a vaginal estrogen, tell your health care provider if you have a history of: Breast cancer. Endometrial cancer. Blood clots. If you are not sexually active and your symptoms are very mild, you may not need treatment. Follow these instructions at home: Medicines Take over-the-counter and prescription medicines only as told by your health care provider. Do not use herbal or alternative medicines unless your health care provider says that you can. Use over-the-counter creams, lubricants, or moisturizers for dryness only as told by your health care provider. General instructions If your atrophic vaginitis is caused by menopause, discuss all of your menopause symptoms and treatment options with your health care provider. Do not douche. Do not use products that can make your vagina dry. These include: Scented feminine sprays. Scented tampons. Scented soaps. Vaginal sex can help to improve blood flow and elasticity of vaginal tissue. If you choose to have sex and it  hurts, try using a water-soluble lubricant or moisturizer right before having sex. Contact a health care provider if: Your discharge looks different than normal. Your vagina has an unusual smell. You have new symptoms. Your symptoms do  not improve with treatment. Your symptoms get worse. Summary Atrophic vaginitis is a condition in which the tissues that line the vagina become dry and thin. It is most common in women who have stopped having regular menstrual periods (are in menopause). Treatment options include using vaginal lubricants and low-dose vaginal estrogen. Contact a health care provider if your vagina has an unusual smell, or if your symptoms get worse or do not improve after treatment. This information is not intended to replace advice given to you by your health care provider. Make sure you discuss any questions you have with your health care provider. Document Revised: 11/22/2019 Document Reviewed: 11/22/2019 Elsevier Patient Education  [place].

## 2022-05-24 ENCOUNTER — Telehealth: Payer: Self-pay | Admitting: *Deleted

## 2022-05-24 DIAGNOSIS — E038 Other specified hypothyroidism: Secondary | ICD-10-CM

## 2022-05-24 MED ORDER — LEVOTHYROXINE SODIUM 125 MCG PO TABS: 125.0000 ug | ORAL_TABLET | Freq: Every day | ORAL | 0 refills | Status: DC

## 2022-05-24 NOTE — Telephone Encounter (Signed)
Patient returned call and left detailed message. Patient request synthroid Rx to [facility] in [place], [place]. Fax [phone]. Pharmacy updated.

## 2022-05-24 NOTE — Telephone Encounter (Signed)
Patient called requesting 90 day supply of brand synthroid 125 mcg tablet, patient said this is cheaper for her if a 90 day supply is prescribed. She asked Rx to be sent to [phone] (Fax) when I put this number in epic. [facility]th[facility] pharmacy.  She will call to schedule her annual exam for 2024.  Rx pending to be signed.

## 2022-05-24 NOTE — Telephone Encounter (Signed)
Please schedule annual exam with me for March, 2024.   Rx signed for Synthroid for 90 days.

## 2022-08-02 NOTE — Progress Notes (Signed)
62 y.o. G79P2002 Married Caucasian female here for annual exam.    Using vaginal vit E suppositories for vaginal atrophy.  States she is due for follow up with urology.   PCP:  None.   Patient's last menstrual period was 12/13/2013.           Sexually active: Yes.    The current method of family planning is status post hysterectomy/vasectomy.    Exercising: Yes.     Hiking, walking , paddle boarding Smoker:  no  Health Maintenance: Pap:  07/30/20 neg, 07/04/17 neg History of abnormal Pap:  yes, Hx of LEEP 2008 for CIN I  MMG:  08/24/21 Breast Density Category C, BI-RADS CATEGORY 1 Neg.  She has appointment on 09/03/22. Colonoscopy:  11/09/21 - due in 10 years.  BMD:   n/a  Result  n/a TDaP:  08/05/21 Gardasil:   no HIV: no Hep C: no Screening Labs:  today   reports that she has never smoked. She has never used smokeless tobacco. She reports current alcohol use of about 2.0 standard drinks of alcohol per week. She reports that she does not use drugs.  Past Medical History:  Diagnosis Date   Basal cell carcinoma    arm, thigh, hand   Benign hematuria    Factor V Leiden mutation ([place]) 12/2013   Heterologous   Hypothyroidism    Migraines    opc med prns    Past Surgical History:  Procedure Laterality Date   CERVICAL BIOPSY  W/ LOOP ELECTRODE EXCISION  2008   CIN-1   COLONOSCOPY  10/2011   Dr.Perry   CYSTOSCOPY N/A 12/25/2013   Procedure: CYSTOSCOPY;  Surgeon: Anastasio Auerbach, MD   DILATION AND CURETTAGE OF UTERUS  02/17/2012   Procedure: DILATATION AND CURETTAGE;  Surgeon: Bennetta Laos, MD;  Location: [facility];  Service: Gynecology;  Laterality: N/A;  attempted with truclear   EYE SURGERY     Lasik eye - bilateral   HYSTEROSCOPY WITH D & C  02/17/2012   Procedure: DILATATION AND CURETTAGE /HYSTEROSCOPY;  Surgeon: Bennetta Laos, MD with polyp resection X 2 polyps   LAPAROSCOPIC ASSISTED VAGINAL HYSTERECTOMY Bilateral 12/25/2013   Procedure: LAPAROSCOPIC ASSISTED  VAGINAL HYSTERECTOMY WITH BILATERAL SALPINGO-OOPHORECTOMY;  Surgeon: Anastasio Auerbach, MD;   RHINOPLASTY     TOTAL HIP ARTHROPLASTY  2021   left   UPPER GASTROINTESTINAL ENDOSCOPY      Current Outpatient Medications  Medication Sig Dispense Refill   Ascorbic Acid (VITAMIN C) 1000 MG tablet Take 1,000 mg by mouth daily. +'250mg'$  root     Cholecalciferol (VITAMIN D PO) Take 1 capsule by mouth as needed.     Cyanocobalamin (VITAMIN B 12 PO) Take by mouth.     ibuprofen (ADVIL) 800 MG tablet Take 1 tablet (800 mg total) by mouth every 8 (eight) hours as needed for mild pain. 60 tablet 1   L-LYSINE PO Take 1 capsule by mouth as needed. For cold sores     METROGEL 1 % gel Apply 1 application topically daily.     Multiple Vitamin (MULTIVITAMIN) tablet Take 1 tablet by mouth daily.     OVER THE COUNTER MEDICATION Take 1 tablet by mouth daily. Align probiotic     Propylene Glycol (SYSTANE BALANCE OP) Apply 1 drop to eye as needed.     sodium chloride (OCEAN) 0.65 % nasal spray Place into the nose.     SYNTHROID 125 MCG tablet TAKE 1 TABLET DAILY FOR HYPOTHYROIDISM 90 tablet  0   tretinoin (RETIN-A) 0.1 % cream Apply 1 application topically at bedtime.     No current facility-administered medications for this visit.    Family History  Problem Relation Age of Onset   Colon polyps Mother    Anuerysm Father        berry brain anuerysm   Colon cancer Brother 73   Cancer Brother        colon   Breast cancer Maternal Aunt 11   Esophageal cancer Neg Hx    Rectal cancer Neg Hx    Stomach cancer Neg Hx     Review of Systems  All other systems reviewed and are negative.   Exam:   BP 120/78 (BP Location: Right Arm, Patient Position: Sitting, Cuff Size: Normal)   Pulse 71   Ht 5' 6.5" (1.689 m)   Wt 139 lb (63 kg)   LMP 12/13/2013   SpO2 98%   BMI 22.10 kg/m     General appearance: alert, cooperative and appears stated age Head: normocephalic, without obvious abnormality,  atraumatic Neck: no adenopathy, supple, symmetrical, trachea midline and thyroid normal to inspection and palpation Lungs: clear to auscultation bilaterally Breasts: normal appearance, no masses or tenderness, No nipple retraction or dimpling, No nipple discharge or bleeding, No axillary adenopathy Heart: regular rate and rhythm Abdomen: soft, non-tender; no masses, no organomegaly Extremities: extremities normal, atraumatic, no cyanosis or edema Skin: skin color, texture, turgor normal. No rashes or lesions Lymph nodes: cervical, supraclavicular, and axillary nodes normal. Neurologic: grossly normal  Pelvic: External genitalia:  no lesions              No abnormal inguinal nodes palpated.              Urethra:  normal appearing urethra with no masses, tenderness or lesions              Bartholins and Skenes: normal                 Vagina: normal appearing vagina with normal color and discharge, no lesions              Cervix: absent              Pap taken: no Bimanual Exam:  Uterus:  absent              Adnexa: no mass, fullness, tenderness              Rectal exam: yes.  Confirms.              Anus:  normal sphincter tone, no lesions  Chaperone was present for exam:  Raquel Sarna  Assessment:   Well woman visit with gynecologic exam. Status post LAVH/BSO for fibroids.  Hx LEEP 2008.  CIN I. Off ERT.  I agree with staying off of estrogens. Factor V Leiden, heterozygous.  Hypothyroidism.  Elevated LDL cholesterol. Hx hematuria. Negative work up.   Plan: Mammogram screening discussed. Self breast awareness reviewed. Pap and HR HPV 2025. Guidelines for Calcium, Vitamin D, regular exercise program including cardiovascular and weight bearing exercise. TSH, Free T4, chol, CMP, CBC.  Refill of Synthroid for one year.  Just did refill for 90 days.   Urinalysis sg 1.010, pH 6.5, NS WBC, 3 - 10 RBC, NS bacteria.  UC sent.  She will contact her urologist for an appointment for microscopic  hematuria. List of [facility] PCPs to patient.  Follow up annually and prn.   After  visit summary provided.

## 2022-08-13 ENCOUNTER — Other Ambulatory Visit: Payer: Self-pay | Admitting: Obstetrics and Gynecology

## 2022-08-13 DIAGNOSIS — E038 Other specified hypothyroidism: Secondary | ICD-10-CM

## 2022-08-13 NOTE — Telephone Encounter (Signed)
Pt requested RX refill for Synthroid. Last refilled 05/24/22. Last AEX 08/05/21. Next AEX 08/16/22. Last mammo 08/24/21

## 2022-08-16 ENCOUNTER — Ambulatory Visit (INDEPENDENT_AMBULATORY_CARE_PROVIDER_SITE_OTHER): Payer: Commercial Managed Care - HMO | Admitting: Obstetrics and Gynecology

## 2022-08-16 ENCOUNTER — Encounter: Payer: Self-pay | Admitting: Obstetrics and Gynecology

## 2022-08-16 VITALS — BP 120/78 | HR 71 | Ht 66.5 in | Wt 139.0 lb

## 2022-08-16 DIAGNOSIS — E039 Hypothyroidism, unspecified: Secondary | ICD-10-CM

## 2022-08-16 DIAGNOSIS — N3021 Other chronic cystitis with hematuria: Secondary | ICD-10-CM

## 2022-08-16 DIAGNOSIS — Z01419 Encounter for gynecological examination (general) (routine) without abnormal findings: Secondary | ICD-10-CM | POA: Diagnosis not present

## 2022-08-16 DIAGNOSIS — Z87448 Personal history of other diseases of urinary system: Secondary | ICD-10-CM

## 2022-08-16 DIAGNOSIS — Z0000 Encounter for general adult medical examination without abnormal findings: Secondary | ICD-10-CM

## 2022-08-16 DIAGNOSIS — E038 Other specified hypothyroidism: Secondary | ICD-10-CM

## 2022-08-16 MED ORDER — LEVOTHYROXINE SODIUM 125 MCG PO TABS: ORAL_TABLET | ORAL | 2 refills | Status: DC

## 2022-08-16 NOTE — Patient Instructions (Signed)

## 2022-08-17 LAB — CBC
HCT: 45.5 % — ABNORMAL HIGH (ref 35.0–45.0)
Hemoglobin: 14.8 g/dL (ref 11.7–15.5)
MCH: 28.4 pg (ref 27.0–33.0)
MCHC: 32.5 g/dL (ref 32.0–36.0)
MCV: 87.3 fL (ref 80.0–100.0)
MPV: 12.3 fL (ref 7.5–12.5)
Platelets: 167 10*3/uL (ref 140–400)
RBC: 5.21 10*6/uL — ABNORMAL HIGH (ref 3.80–5.10)
RDW: 12.5 % (ref 11.0–15.0)
WBC: 4 10*3/uL (ref 3.8–10.8)

## 2022-08-17 LAB — COMPREHENSIVE METABOLIC PANEL
AG Ratio: 1.8 (calc) (ref 1.0–2.5)
ALT: 20 U/L (ref 6–29)
AST: 23 U/L (ref 10–35)
Albumin: 4.5 g/dL (ref 3.6–5.1)
Alkaline phosphatase (APISO): 59 U/L (ref 37–153)
BUN: 23 mg/dL (ref 7–25)
CO2: 30 mmol/L (ref 20–32)
Calcium: 9.5 mg/dL (ref 8.6–10.4)
Chloride: 103 mmol/L (ref 98–110)
Creat: 0.71 mg/dL (ref 0.50–1.05)
Globulin: 2.5 g/dL (calc) (ref 1.9–3.7)
Glucose, Bld: 76 mg/dL (ref 65–99)
Potassium: 4.2 mmol/L (ref 3.5–5.3)
Sodium: 140 mmol/L (ref 135–146)
Total Bilirubin: 0.6 mg/dL (ref 0.2–1.2)
Total Protein: 7 g/dL (ref 6.1–8.1)

## 2022-08-17 LAB — LIPID PANEL
Cholesterol: 214 mg/dL — ABNORMAL HIGH (ref <200)
HDL: 70 mg/dL (ref >=50)
LDL Cholesterol (Calc): 126 mg/dL (calc) — ABNORMAL HIGH
Non-HDL Cholesterol (Calc): 144 mg/dL (calc) — ABNORMAL HIGH (ref <130)
Total CHOL/HDL Ratio: 3.1 (calc) (ref <5.0)
Triglycerides: 82 mg/dL (ref <150)

## 2022-08-17 LAB — T4, FREE: Free T4: 1.2 ng/dL (ref 0.8–1.8)

## 2022-08-17 LAB — TSH: TSH: 1.19 mIU/L (ref 0.40–4.50)

## 2022-08-18 LAB — URINALYSIS, COMPLETE W/RFL CULTURE
Bacteria, UA: NONE SEEN /HPF
Bilirubin Urine: NEGATIVE
Casts: NONE SEEN /LPF
Crystals: NONE SEEN /HPF
Glucose, UA: NEGATIVE
Hyaline Cast: NONE SEEN /LPF
Ketones, ur: NEGATIVE
Leukocyte Esterase: NEGATIVE
Nitrites, Initial: NEGATIVE
Protein, ur: NEGATIVE
Specific Gravity, Urine: 1.01 (ref 1.001–1.035)
WBC, UA: NONE SEEN /HPF (ref 0–5)
Yeast: NONE SEEN /HPF
pH: 6.5 (ref 5.0–8.0)

## 2022-08-18 LAB — URINE CULTURE
MICRO NUMBER:: 14674792
Result:: NO GROWTH
SPECIMEN QUALITY:: ADEQUATE

## 2022-08-18 LAB — CULTURE INDICATED

## 2022-09-08 ENCOUNTER — Encounter: Payer: Self-pay | Admitting: Obstetrics and Gynecology

## 2022-09-13 ENCOUNTER — Encounter: Payer: Self-pay | Admitting: Obstetrics and Gynecology

## 2023-03-31 ENCOUNTER — Other Ambulatory Visit: Payer: Self-pay | Admitting: Obstetrics and Gynecology

## 2023-03-31 DIAGNOSIS — E038 Other specified hypothyroidism: Secondary | ICD-10-CM

## 2023-03-31 NOTE — Telephone Encounter (Signed)
Med refill request:levothyroxine 125 mcg daily Last AEX: 08/16/22 Next AEX:not yet scheduled Last MMG (if hormonal med) 09/03/22 Refill authorized:Please approve or deny as appropriate.

## 2023-09-07 ENCOUNTER — Encounter: Payer: Self-pay | Admitting: Obstetrics and Gynecology

## 2023-09-10 ENCOUNTER — Encounter: Payer: Self-pay | Admitting: Obstetrics and Gynecology

## 2023-10-10 ENCOUNTER — Ambulatory Visit (INDEPENDENT_AMBULATORY_CARE_PROVIDER_SITE_OTHER): Payer: Commercial Managed Care - HMO | Admitting: Obstetrics and Gynecology

## 2023-10-10 ENCOUNTER — Other Ambulatory Visit (HOSPITAL_COMMUNITY)
Admission: RE | Admit: 2023-10-10 | Discharge: 2023-10-10 | Disposition: A | Discharge status: Home / Self Care | Source: Ambulatory Visit | Attending: Obstetrics and Gynecology | Admitting: Obstetrics and Gynecology

## 2023-10-10 ENCOUNTER — Encounter: Payer: Self-pay | Admitting: Obstetrics and Gynecology

## 2023-10-10 VITALS — BP 124/82 | HR 75 | Ht 67.25 in | Wt 141.0 lb

## 2023-10-10 DIAGNOSIS — Z1272 Encounter for screening for malignant neoplasm of vagina: Secondary | ICD-10-CM | POA: Diagnosis present

## 2023-10-10 DIAGNOSIS — Z1331 Encounter for screening for depression: Secondary | ICD-10-CM

## 2023-10-10 DIAGNOSIS — Z87448 Personal history of other diseases of urinary system: Secondary | ICD-10-CM | POA: Diagnosis not present

## 2023-10-10 DIAGNOSIS — Z01419 Encounter for gynecological examination (general) (routine) without abnormal findings: Secondary | ICD-10-CM

## 2023-10-10 DIAGNOSIS — E039 Hypothyroidism, unspecified: Secondary | ICD-10-CM

## 2023-10-10 DIAGNOSIS — Z0000 Encounter for general adult medical examination without abnormal findings: Secondary | ICD-10-CM

## 2023-10-10 DIAGNOSIS — N952 Postmenopausal atrophic vaginitis: Secondary | ICD-10-CM

## 2023-10-10 DIAGNOSIS — E038 Other specified hypothyroidism: Secondary | ICD-10-CM

## 2023-10-10 MED ORDER — NONFORMULARY OR COMPOUNDED ITEM: 3 refills | Status: AC

## 2023-10-10 MED ORDER — LEVOTHYROXINE SODIUM 125 MCG PO TABS: ORAL_TABLET | ORAL | 3 refills | Status: DC

## 2023-10-10 NOTE — Patient Instructions (Signed)

## 2023-10-10 NOTE — Progress Notes (Signed)
 63 y.o. G69P2002 Married Caucasian female here for annual exam.    Wants fasting labs today.   Patient is followed for hypothyroidism.  No concerns.   Asking to check her urine.  Has chronic hematuria.  No visible blood in the urine.  No dysuria.   Having pain with intercourse.  Feels a band.   Tried vit E.   PCP: Patient, No Pcp Per   Patient's last menstrual period was 12/13/2013.           Sexually active: Yes.    The current method of family planning is Hysterectomy .    Menopausal hormone therapy:  no Exercising: Yes.     Paddle boarding, walking, hiking  Smoker:  no  OB History  Gravida Para Term Preterm AB Living  2 2 2   0 2  SAB IAB Ectopic Multiple Live Births    0      # Outcome Date GA Lbr Len/2nd Weight Sex Type Anes PTL Lv  2 Term           1 Term              HEALTH MAINTENANCE: Last 2 paps:  07/30/20 neg, 07/04/17 neg  History of abnormal Pap or positive HPV:  yes - LEEP 2008 Mammogram:   09/05/23 Breast Density Cat C, Birads cat 1 neg  Colonoscopy:  11/09/21 - due in 2033.  Bone Density:  n/a  Result  n/a   Immunization History  Administered Date(s) Administered   Influenza Inj Mdck Quad Pf 03/03/2018   Influenza,inj,Quad PF,6+ Mos 04/22/2017   PFIZER(Purple Top)SARS-COV-2 Vaccination 08/06/2019, 09/05/2019   Tdap 08/05/2021   Zoster Recombinant(Shingrix) 07/20/2017, 10/17/2017      reports that she has never smoked. She has never used smokeless tobacco. She reports current alcohol use of about 2.0 standard drinks of alcohol per week. She reports that she does not use drugs.  Past Medical History:  Diagnosis Date   Basal cell carcinoma    arm, thigh, hand   Benign hematuria    Factor V Leiden mutation (HCC) 12/2013   Heterologous   Hypothyroidism    Migraines    opc med prns    Past Surgical History:  Procedure Laterality Date   CERVICAL BIOPSY  W/ LOOP ELECTRODE EXCISION  2008   CIN-1   COLONOSCOPY  10/2011   Dr.Perry   CYSTOSCOPY  N/A 12/25/2013   Procedure: CYSTOSCOPY;  Surgeon: Lacretia Piccolo, MD   DILATION AND CURETTAGE OF UTERUS  02/17/2012   Procedure: DILATATION AND CURETTAGE;  Surgeon: Francia Ip, MD;  Location: [facility];  Service: Gynecology;  Laterality: N/A;  attempted with truclear   EYE SURGERY     Lasik eye - bilateral   HYSTEROSCOPY WITH D & C  02/17/2012   Procedure: DILATATION AND CURETTAGE /HYSTEROSCOPY;  Surgeon: Francia Ip, MD with polyp resection X 2 polyps   LAPAROSCOPIC ASSISTED VAGINAL HYSTERECTOMY Bilateral 12/25/2013   Procedure: LAPAROSCOPIC ASSISTED VAGINAL HYSTERECTOMY WITH BILATERAL SALPINGO-OOPHORECTOMY;  Surgeon: Lacretia Piccolo, MD;   RHINOPLASTY     TOTAL HIP ARTHROPLASTY  2021   left   UPPER GASTROINTESTINAL ENDOSCOPY      Current Outpatient Medications  Medication Sig Dispense Refill   Ascorbic Acid (VITAMIN C) 1000 MG tablet Take 1,000 mg by mouth daily. +250mg  root     Cholecalciferol (VITAMIN D  PO) Take 1 capsule by mouth as needed.     Cyanocobalamin (VITAMIN B 12 PO) Take by mouth.  ibuprofen  (ADVIL ) 800 MG tablet Take 1 tablet (800 mg total) by mouth every 8 (eight) hours as needed for mild pain. 60 tablet 1   L-LYSINE PO Take 1 capsule by mouth as needed. For cold sores     levothyroxine  (SYNTHROID ) 125 MCG tablet TAKE 1 TABLET DAILY FOR HYPOTHYROIDISM 90 tablet 1   METROGEL  1 % gel Apply 1 application topically daily.     Multiple Vitamin (MULTIVITAMIN) tablet Take 1 tablet by mouth daily.     OVER THE COUNTER MEDICATION Take 1 tablet by mouth daily. Align probiotic     Propylene Glycol (SYSTANE BALANCE OP) Apply 1 drop to eye as needed.     sodium chloride  (OCEAN) 0.65 % nasal spray Place into the nose.     tretinoin (RETIN-A) 0.1 % cream Apply 1 application topically at bedtime.     No current facility-administered medications for this visit.    ALLERGIES: Gramineae pollens and Doxycycline  Family History  Problem Relation Age of Onset    Colon polyps Mother    Anuerysm Father        berry brain anuerysm   Colon cancer Brother 26   Cancer Brother        colon   Breast cancer Maternal Aunt 60   Esophageal cancer Neg Hx    Rectal cancer Neg Hx    Stomach cancer Neg Hx     Review of Systems  All other systems reviewed and are negative.   PHYSICAL EXAM:  BP 124/82 (BP Location: Left Arm, Patient Position: Sitting)   Pulse 75   Ht 5' 7.25" (1.708 m)   Wt 141 lb (64 kg)   LMP 12/13/2013   SpO2 97%   BMI 21.92 kg/m     General appearance: alert, cooperative and appears stated age Head: normocephalic, without obvious abnormality, atraumatic Neck: no adenopathy, supple, symmetrical, trachea midline and thyroid normal to inspection and palpation Lungs: clear to auscultation bilaterally Breasts: normal appearance, no masses or tenderness, No nipple retraction or dimpling, No nipple discharge or bleeding, No axillary adenopathy Heart: regular rate and rhythm Abdomen: soft, non-tender; no masses, no organomegaly Extremities: extremities normal, atraumatic, no cyanosis or edema Skin: skin color, texture, turgor normal. No rashes or lesions Lymph nodes: cervical, supraclavicular, and axillary nodes normal. Neurologic: grossly normal  Pelvic: External genitalia:  no lesions              No abnormal inguinal nodes palpated.              Urethra:  normal appearing urethra with no masses, tenderness or lesions              Bartholins and Skenes: normal                 Vagina: normal appearing vagina with normal color and discharge, no lesions              Cervix:  absent              Pap taken: yes Bimanual Exam:  Uterus: absent              Adnexa: no mass, fullness, tenderness              Rectal exam: yes.  Confirms.              Anus:  normal sphincter tone, no lesions  Chaperone was present for exam:  Reagan Camera, CMA  ASSESSMENT: Well woman visit with gynecologic exam.  Status post LAVH/BSO for fibroids.  Vaginal  cancer screening.  Hx LEEP 2008.  CIN I. Factor V Leiden, heterozygous.  Hypothyroidism.  Elevated LDL cholesterol. Hx hematuria. Negative work up.  Depression screening.  PHQ-9: 0 Dyspareunia.   PLAN: Mammogram screening discussed. Self breast awareness reviewed. Pap and HRV collected:  yes Guidelines for Calcium, Vitamin D , regular exercise program including cardiovascular and weight bearing exercise. Medication refills:  Synthroid  125 mcg po daily, #24, RF3.  Vaginal vit E suppository, 200 mcg pv at hs twice weekly.  #24, RF 3.  Chol, CMP, CBC, TFTs, vit D.  Urinalysis and reflex culture.  She will establish care with a PCP.  Follow up:  yearly and prn.

## 2023-10-11 ENCOUNTER — Other Ambulatory Visit: Payer: Self-pay | Admitting: Obstetrics and Gynecology

## 2023-10-11 ENCOUNTER — Encounter: Payer: Self-pay | Admitting: Obstetrics and Gynecology

## 2023-10-11 DIAGNOSIS — R718 Other abnormality of red blood cells: Secondary | ICD-10-CM

## 2023-10-11 DIAGNOSIS — D696 Thrombocytopenia, unspecified: Secondary | ICD-10-CM

## 2023-10-11 DIAGNOSIS — D72819 Decreased white blood cell count, unspecified: Secondary | ICD-10-CM

## 2023-10-11 LAB — COMPREHENSIVE METABOLIC PANEL WITH GFR
AG Ratio: 1.8 (calc) (ref 1.0–2.5)
ALT: 17 U/L (ref 6–29)
AST: 23 U/L (ref 10–35)
Albumin: 4.6 g/dL (ref 3.6–5.1)
Alkaline phosphatase (APISO): 69 U/L (ref 37–153)
BUN: 19 mg/dL (ref 7–25)
CO2: 31 mmol/L (ref 20–32)
Calcium: 9.4 mg/dL (ref 8.6–10.4)
Chloride: 101 mmol/L (ref 98–110)
Creat: 0.75 mg/dL (ref 0.50–1.05)
Globulin: 2.5 g/dL (ref 1.9–3.7)
Glucose, Bld: 85 mg/dL (ref 65–99)
Potassium: 3.9 mmol/L (ref 3.5–5.3)
Sodium: 139 mmol/L (ref 135–146)
Total Bilirubin: 0.6 mg/dL (ref 0.2–1.2)
Total Protein: 7.1 g/dL (ref 6.1–8.1)
eGFR: 90 mL/min/{1.73_m2} (ref >=60)

## 2023-10-11 LAB — CBC
HCT: 45.1 % — ABNORMAL HIGH (ref 35.0–45.0)
Hemoglobin: 14.6 g/dL (ref 11.7–15.5)
MCH: 29.3 pg (ref 27.0–33.0)
MCHC: 32.4 g/dL (ref 32.0–36.0)
MCV: 90.4 fL (ref 80.0–100.0)
MPV: 12.3 fL (ref 7.5–12.5)
Platelets: 139 10*3/uL — ABNORMAL LOW (ref 140–400)
RBC: 4.99 10*6/uL (ref 3.80–5.10)
RDW: 12.4 % (ref 11.0–15.0)
WBC: 3.4 10*3/uL — ABNORMAL LOW (ref 3.8–10.8)

## 2023-10-11 LAB — LIPID PANEL
Cholesterol: 202 mg/dL — ABNORMAL HIGH (ref <200)
HDL: 70 mg/dL (ref >=50)
LDL Cholesterol (Calc): 116 mg/dL — ABNORMAL HIGH
Non-HDL Cholesterol (Calc): 132 mg/dL — ABNORMAL HIGH (ref <130)
Total CHOL/HDL Ratio: 2.9 (calc) (ref <5.0)
Triglycerides: 70 mg/dL (ref <150)

## 2023-10-11 LAB — CYTOLOGY - PAP
Comment: NEGATIVE
Diagnosis: NEGATIVE
High risk HPV: NEGATIVE

## 2023-10-11 LAB — VITAMIN D 25 HYDROXY (VIT D DEFICIENCY, FRACTURES): Vit D, 25-Hydroxy: 46 ng/mL (ref 30–100)

## 2023-10-11 LAB — TSH: TSH: 2.01 m[IU]/L (ref 0.40–4.50)

## 2023-10-11 LAB — T4, FREE: Free T4: 1.2 ng/dL (ref 0.8–1.8)

## 2023-10-12 LAB — URINALYSIS, COMPLETE W/RFL CULTURE
Bilirubin Urine: NEGATIVE
Glucose, UA: NEGATIVE
Hyaline Cast: NONE SEEN /LPF
Ketones, ur: NEGATIVE
Leukocyte Esterase: NEGATIVE
Nitrites, Initial: NEGATIVE
Protein, ur: NEGATIVE
Specific Gravity, Urine: 1.01 (ref 1.001–1.035)
pH: 6 (ref 5.0–8.0)

## 2023-10-12 LAB — URINE CULTURE
MICRO NUMBER:: 16412560
Result:: NO GROWTH
SPECIMEN QUALITY:: ADEQUATE

## 2023-10-12 LAB — CULTURE INDICATED

## 2023-11-16 ENCOUNTER — Other Ambulatory Visit: Payer: Self-pay | Admitting: Obstetrics and Gynecology

## 2023-11-16 DIAGNOSIS — E038 Other specified hypothyroidism: Secondary | ICD-10-CM

## 2023-11-16 NOTE — Telephone Encounter (Signed)
 Med refill request: levothyroxine   Last AEX: 10/10/23 Next AEX: not scheduled Last MMG (if hormonal med) 09/05/23 birads cat 1 neg  Refill authorized: Requesting prescription be sent to a different pharmacy now. Last RX 10/10/23 #90 with 3 refills. Please advise

## 2023-11-28 ENCOUNTER — Other Ambulatory Visit

## 2023-11-28 DIAGNOSIS — R718 Other abnormality of red blood cells: Secondary | ICD-10-CM

## 2023-11-28 DIAGNOSIS — D72819 Decreased white blood cell count, unspecified: Secondary | ICD-10-CM

## 2023-11-28 DIAGNOSIS — D696 Thrombocytopenia, unspecified: Secondary | ICD-10-CM

## 2023-11-28 LAB — CBC WITH DIFFERENTIAL/PLATELET
Absolute Lymphocytes: 2230 {cells}/uL (ref 850–3900)
Absolute Monocytes: 437 {cells}/uL (ref 200–950)
Basophils Absolute: 49 {cells}/uL (ref 0–200)
Basophils Relative: 0.9 %
Eosinophils Absolute: 103 {cells}/uL (ref 15–500)
Eosinophils Relative: 1.9 %
HCT: 43 % (ref 35.0–45.0)
Hemoglobin: 13.9 g/dL (ref 11.7–15.5)
MCH: 28.9 pg (ref 27.0–33.0)
MCHC: 32.3 g/dL (ref 32.0–36.0)
MCV: 89.4 fL (ref 80.0–100.0)
MPV: 11.9 fL (ref 7.5–12.5)
Monocytes Relative: 8.1 %
Neutro Abs: 2581 {cells}/uL (ref 1500–7800)
Neutrophils Relative %: 47.8 %
Platelets: 193 10*3/uL (ref 140–400)
RBC: 4.81 10*6/uL (ref 3.80–5.10)
RDW: 12.6 % (ref 11.0–15.0)
Total Lymphocyte: 41.3 %
WBC: 5.4 10*3/uL (ref 3.8–10.8)

## 2023-11-29 ENCOUNTER — Ambulatory Visit: Payer: Self-pay | Admitting: Obstetrics and Gynecology

## 2024-02-27 ENCOUNTER — Encounter: Payer: Self-pay | Admitting: Obstetrics and Gynecology

## 2024-11-06 ENCOUNTER — Ambulatory Visit: Admitting: Obstetrics and Gynecology
